# Patient Record
Sex: Female | Born: 1970
Health system: Southern US, Community
[De-identification: ages and names within clinical notes are randomized; demographics above are authoritative.]

## PROBLEM LIST (undated history)

## (undated) DIAGNOSIS — C44729 Squamous cell carcinoma of skin of left lower limb, including hip: Secondary | ICD-10-CM

## (undated) DIAGNOSIS — I10 Essential (primary) hypertension: Secondary | ICD-10-CM

## (undated) DIAGNOSIS — K8020 Calculus of gallbladder without cholecystitis without obstruction: Secondary | ICD-10-CM

## (undated) DIAGNOSIS — T7840XA Allergy, unspecified, initial encounter: Secondary | ICD-10-CM

## (undated) DIAGNOSIS — R011 Cardiac murmur, unspecified: Secondary | ICD-10-CM

## (undated) DIAGNOSIS — N809 Endometriosis, unspecified: Secondary | ICD-10-CM

## (undated) DIAGNOSIS — K219 Gastro-esophageal reflux disease without esophagitis: Secondary | ICD-10-CM

## (undated) DIAGNOSIS — D509 Iron deficiency anemia, unspecified: Secondary | ICD-10-CM

## (undated) DIAGNOSIS — E119 Type 2 diabetes mellitus without complications: Secondary | ICD-10-CM

## (undated) DIAGNOSIS — C801 Malignant (primary) neoplasm, unspecified: Secondary | ICD-10-CM

## (undated) HISTORY — DX: Calculus of gallbladder without cholecystitis without obstruction: K80.20

## (undated) HISTORY — PX: MYOMECTOMY: SHX85

## (undated) HISTORY — DX: Endometriosis, unspecified: N80.9

## (undated) HISTORY — DX: Iron deficiency anemia, unspecified: D50.9

## (undated) HISTORY — DX: Squamous cell carcinoma of skin of left lower limb, including hip: C44.729

## (undated) HISTORY — DX: Malignant (primary) neoplasm, unspecified: C80.1

## (undated) HISTORY — DX: Gastro-esophageal reflux disease without esophagitis: K21.9

## (undated) HISTORY — DX: Cardiac murmur, unspecified: R01.1

## (undated) HISTORY — DX: Allergy, unspecified, initial encounter: T78.40XA

---
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
## 1995-10-17 HISTORY — PX: ABLATION ON ENDOMETRIOSIS: SHX5787

## 1998-08-16 ENCOUNTER — Other Ambulatory Visit: Admission: RE | Admit: 1998-08-16 | Discharge: 1998-08-16 | Payer: Self-pay | Admitting: Obstetrics and Gynecology

## 1999-03-09 ENCOUNTER — Inpatient Hospital Stay (HOSPITAL_COMMUNITY): Admission: AD | Admit: 1999-03-09 | Discharge: 1999-03-09 | Payer: Self-pay | Admitting: Obstetrics and Gynecology

## 1999-03-10 ENCOUNTER — Inpatient Hospital Stay (HOSPITAL_COMMUNITY): Admission: AD | Admit: 1999-03-10 | Discharge: 1999-03-12 | Payer: Self-pay | Admitting: Obstetrics and Gynecology

## 1999-04-29 ENCOUNTER — Other Ambulatory Visit: Admission: RE | Admit: 1999-04-29 | Discharge: 1999-04-29 | Payer: Self-pay | Admitting: Obstetrics and Gynecology

## 2000-08-24 ENCOUNTER — Other Ambulatory Visit: Admission: RE | Admit: 2000-08-24 | Discharge: 2000-08-24 | Payer: Self-pay | Admitting: Obstetrics and Gynecology

## 2001-09-25 ENCOUNTER — Other Ambulatory Visit: Admission: RE | Admit: 2001-09-25 | Discharge: 2001-09-25 | Payer: Self-pay | Admitting: Obstetrics and Gynecology

## 2002-12-02 ENCOUNTER — Other Ambulatory Visit: Admission: RE | Admit: 2002-12-02 | Discharge: 2002-12-02 | Payer: Self-pay | Admitting: Obstetrics and Gynecology

## 2004-02-01 ENCOUNTER — Other Ambulatory Visit: Admission: RE | Admit: 2004-02-01 | Discharge: 2004-02-01 | Payer: Self-pay | Admitting: Obstetrics and Gynecology

## 2005-04-12 ENCOUNTER — Other Ambulatory Visit: Admission: RE | Admit: 2005-04-12 | Discharge: 2005-04-12 | Payer: Self-pay | Admitting: Obstetrics and Gynecology

## 2012-07-01 ENCOUNTER — Other Ambulatory Visit (HOSPITAL_COMMUNITY): Payer: Self-pay | Admitting: Obstetrics and Gynecology

## 2012-07-01 DIAGNOSIS — Z1231 Encounter for screening mammogram for malignant neoplasm of breast: Secondary | ICD-10-CM

## 2012-07-18 ENCOUNTER — Ambulatory Visit (HOSPITAL_COMMUNITY)
Admission: RE | Admit: 2012-07-18 | Discharge: 2012-07-18 | Disposition: A | Discharge status: Home / Self Care | Payer: Self-pay | Source: Ambulatory Visit | Attending: Obstetrics and Gynecology | Admitting: Obstetrics and Gynecology

## 2012-07-18 DIAGNOSIS — Z1231 Encounter for screening mammogram for malignant neoplasm of breast: Secondary | ICD-10-CM

## 2013-06-30 ENCOUNTER — Other Ambulatory Visit (HOSPITAL_COMMUNITY): Payer: Self-pay | Admitting: Obstetrics and Gynecology

## 2013-06-30 DIAGNOSIS — Z1231 Encounter for screening mammogram for malignant neoplasm of breast: Secondary | ICD-10-CM

## 2013-07-28 ENCOUNTER — Ambulatory Visit (HOSPITAL_COMMUNITY)
Admission: RE | Admit: 2013-07-28 | Discharge: 2013-07-28 | Disposition: A | Discharge status: Home / Self Care | Payer: BC Managed Care – PPO | Source: Ambulatory Visit | Attending: Obstetrics and Gynecology | Admitting: Obstetrics and Gynecology

## 2013-07-28 DIAGNOSIS — Z1231 Encounter for screening mammogram for malignant neoplasm of breast: Secondary | ICD-10-CM

## 2014-04-07 ENCOUNTER — Encounter: Payer: Self-pay | Admitting: Internal Medicine

## 2016-07-16 LAB — HM PAP SMEAR: HM Pap smear: NORMAL

## 2016-07-16 LAB — HM MAMMOGRAPHY: HM Mammogram: NORMAL (ref 0–4)

## 2016-09-19 LAB — HM MAMMOGRAPHY

## 2016-09-19 LAB — HM PAP SMEAR: HM Pap smear: NORMAL

## 2016-09-28 LAB — BASIC METABOLIC PANEL: Glucose: 84

## 2016-09-28 LAB — LIPID PANEL
CHOLESTEROL: 161 (ref 0–200)
HDL: 59 (ref 35–70)
LDL CALC: 80
TRIGLYCERIDES: 109 (ref 40–160)

## 2016-09-28 LAB — TSH: TSH: 2.2 (ref <=5.90)

## 2016-11-14 LAB — VITAMIN D 25 HYDROXY (VIT D DEFICIENCY, FRACTURES): Vit D, 25-Hydroxy: 42

## 2017-04-16 ENCOUNTER — Ambulatory Visit (INDEPENDENT_AMBULATORY_CARE_PROVIDER_SITE_OTHER): Payer: BLUE CROSS/BLUE SHIELD | Admitting: Physician Assistant

## 2017-04-16 ENCOUNTER — Encounter: Payer: Self-pay | Admitting: Physician Assistant

## 2017-04-16 VITALS — BP 130/82 | HR 94 | Temp 98.8°F | Resp 14 | Ht 63.0 in | Wt 197.0 lb

## 2017-04-16 DIAGNOSIS — E669 Obesity, unspecified: Secondary | ICD-10-CM | POA: Diagnosis not present

## 2017-04-16 DIAGNOSIS — M722 Plantar fascial fibromatosis: Secondary | ICD-10-CM | POA: Diagnosis not present

## 2017-04-16 DIAGNOSIS — D508 Other iron deficiency anemias: Secondary | ICD-10-CM | POA: Diagnosis not present

## 2017-04-16 DIAGNOSIS — E6609 Other obesity due to excess calories: Secondary | ICD-10-CM | POA: Insufficient documentation

## 2017-04-16 DIAGNOSIS — D649 Anemia, unspecified: Secondary | ICD-10-CM | POA: Insufficient documentation

## 2017-04-16 DIAGNOSIS — Z6834 Body mass index (BMI) 34.0-34.9, adult: Secondary | ICD-10-CM | POA: Insufficient documentation

## 2017-04-16 NOTE — Patient Instructions (Signed)
Please continue supplements and chronic medications as directed. Follow-up with specialists as scheduled. We will get records from Gynecology to review recent lab work.  Please schedule an appointment for a complete physical at your earliest convenience.   Continue well-balanced, portion controlled diet. We will get you back to walking once the fasciitis is resolved. Try giving swimming a try - this will be easy on joints and is great exercise.

## 2017-04-16 NOTE — Progress Notes (Signed)
Pre visit review using our clinic review tool, if applicable. No additional management support is needed unless otherwise documented below in the visit note. 

## 2017-04-16 NOTE — Assessment & Plan Note (Signed)
History of iron deficiency anemia. Asymptomatic. Vitals stable. Will obtain records of recent labs for review. Will add-on additional labs as needed.

## 2017-04-16 NOTE — Progress Notes (Signed)
Patient presents to clinic today to establish care. Body mass index is 34.9 kg/m. Patient endorses well-balanced diet overall. Avoids fried and processed foods. No exercise at present. Was walking about 10 miles per week but had to stop after developing plantar fasciitis. (see below).  Acute Concerns: Denies acute concerns today.  Chronic Issues: Endometriosis -- Long-standing history. On OCPs. Followed by Gynecology (Dr. Radene [name]). No current concerns. States GYN drew labs recently. Will need copies.  Plantar Fasciitis -- Followed by Podiatry. Is having custom orthotics made to help with symptoms.   Health Maintenance: Immunizations -- Declines Tetanus today.  Declines HIV Screening. Mammogram -- Up-to-date. Normal per patient. Followed by GYN. PAP -- Up-to-date. Normal per patient. Followed by GYN. Will need records.  Past Medical History:  Diagnosis Date  . Allergy   . Endometriosis    Diagnosed at age 40. Followed by Gynecology -- Dr. Radene [name]  . Heart murmur   . Iron deficiency anemia    on OTC supplement    Past Surgical History:  Procedure Laterality Date  . ABLATION ON ENDOMETRIOSIS  1997  . deviated setum  1986    No current outpatient prescriptions on file prior to visit.   No current facility-administered medications on file prior to visit.     Allergies  Allergen Reactions  . Erythromycin Rash  . Nitrofurantoin Rash    Family History  Problem Relation Age of Onset  . Hypertension Mother   . Diabetes Mother   . Heart attack Father 29  . Lung disease Father   . Hypertension Father   . Hepatitis C Maternal Uncle   . Lung disease Paternal Uncle   . Emphysema Maternal Grandmother   . Food Allergy Daughter   . Asthma Daughter     Social History   Social History  . Marital status: Married    Spouse name: N/A  . Number of children: N/A  . Years of education: N/A   Occupational History  . Not on file.   Social History Main Topics  . Smoking  status: Never Smoker  . Smokeless tobacco: Never Used  . Alcohol use No  . Drug use: No  . Sexual activity: Yes    Birth control/ protection: Pill   Other Topics Concern  . Not on file   Social History Narrative  . No narrative on file   Review of Systems  Constitutional: Negative for fever and malaise/fatigue.  Respiratory: Negative for cough and shortness of breath.   Cardiovascular: Negative for chest pain and palpitations.  Gastrointestinal: Negative for heartburn.  Neurological: Negative for dizziness and loss of consciousness.  Psychiatric/Behavioral: Negative for depression, hallucinations, substance abuse and suicidal ideas. The patient is not nervous/anxious and does not have insomnia.    BP 130/82   Pulse 94   Temp 98.8 F (37.1 C) (Oral)   Resp 14   Ht 5\' 3"  (1.6 m)   Wt 197 lb (89.4 kg)   SpO2 98%   BMI 34.90 kg/m   Physical Exam  Constitutional: She is oriented to person, place, and time and well-developed, well-nourished, and in no distress.  HENT:  Head: Normocephalic and atraumatic.  Eyes: Conjunctivae are normal.  Neck: Neck supple.  Cardiovascular: Normal rate, regular rhythm, normal heart sounds and intact distal pulses.   Pulmonary/Chest: Effort normal and breath sounds normal. No respiratory distress. She has no wheezes. She has no rales. She exhibits no tenderness.  Neurological: She is alert and oriented to person, place, and time.  Skin: Skin is warm and dry. No rash noted.  Psychiatric: Affect normal.  Vitals reviewed.  Assessment/Plan: Plantar fasciitis Chronic . Continued despite conservative measures. Is followed by Podiatry. Is going to start custom orthotics. Follow-up with specialist as scheduled.  Class 1 obesity without serious comorbidity in adult Body mass index is 34.9 kg/m. Discussed appropriate diet and exercise regimen. Will obtain recent labs and assessment with GYN. Patient to schedule CPE.  Absolute anemia History of  iron deficiency anemia. Asymptomatic. Vitals stable. Will obtain records of recent labs for review. Will add-on additional labs as needed.    Anna Rio, PA-C

## 2017-04-16 NOTE — Assessment & Plan Note (Signed)
Chronic . Continued despite conservative measures. Is followed by Podiatry. Is going to start custom orthotics. Follow-up with specialist as scheduled.

## 2017-04-16 NOTE — Assessment & Plan Note (Signed)
Body mass index is 34.9 kg/m. Discussed appropriate diet and exercise regimen. Will obtain recent labs and assessment with GYN. Patient to schedule CPE.

## 2017-04-24 ENCOUNTER — Encounter: Payer: Self-pay | Admitting: Emergency Medicine

## 2017-12-12 ENCOUNTER — Other Ambulatory Visit (HOSPITAL_COMMUNITY): Payer: Self-pay | Admitting: Obstetrics and Gynecology

## 2017-12-12 DIAGNOSIS — D1800 Hemangioma unspecified site: Secondary | ICD-10-CM

## 2017-12-14 ENCOUNTER — Ambulatory Visit (HOSPITAL_COMMUNITY): Payer: BLUE CROSS/BLUE SHIELD

## 2017-12-18 ENCOUNTER — Ambulatory Visit (HOSPITAL_COMMUNITY)
Admission: RE | Admit: 2017-12-18 | Discharge: 2017-12-18 | Disposition: A | Discharge status: Home / Self Care | Payer: BLUE CROSS/BLUE SHIELD | Source: Ambulatory Visit | Attending: Obstetrics and Gynecology | Admitting: Obstetrics and Gynecology

## 2017-12-18 DIAGNOSIS — K8020 Calculus of gallbladder without cholecystitis without obstruction: Secondary | ICD-10-CM | POA: Diagnosis not present

## 2017-12-18 DIAGNOSIS — D1800 Hemangioma unspecified site: Secondary | ICD-10-CM | POA: Insufficient documentation

## 2017-12-25 ENCOUNTER — Encounter: Payer: Self-pay | Admitting: Physician Assistant

## 2018-01-04 ENCOUNTER — Ambulatory Visit: Payer: BLUE CROSS/BLUE SHIELD | Admitting: Physician Assistant

## 2018-01-04 ENCOUNTER — Encounter: Payer: Self-pay | Admitting: Physician Assistant

## 2018-01-04 ENCOUNTER — Other Ambulatory Visit (INDEPENDENT_AMBULATORY_CARE_PROVIDER_SITE_OTHER): Payer: BLUE CROSS/BLUE SHIELD

## 2018-01-04 VITALS — BP 132/80 | HR 78 | Ht 63.0 in | Wt 194.2 lb

## 2018-01-04 DIAGNOSIS — R933 Abnormal findings on diagnostic imaging of other parts of digestive tract: Secondary | ICD-10-CM

## 2018-01-04 DIAGNOSIS — R1013 Epigastric pain: Secondary | ICD-10-CM

## 2018-01-04 DIAGNOSIS — K760 Fatty (change of) liver, not elsewhere classified: Secondary | ICD-10-CM | POA: Diagnosis not present

## 2018-01-04 DIAGNOSIS — K8020 Calculus of gallbladder without cholecystitis without obstruction: Secondary | ICD-10-CM

## 2018-01-04 LAB — CBC WITH DIFFERENTIAL/PLATELET
BASOS ABS: 0.1 10*3/uL (ref 0.0–0.1)
Basophils Relative: 1 % (ref 0.0–3.0)
EOS ABS: 0.2 10*3/uL (ref 0.0–0.7)
Eosinophils Relative: 2.6 % (ref 0.0–5.0)
HEMATOCRIT: 33.2 % — AB (ref 36.0–46.0)
Hemoglobin: 11.2 g/dL — ABNORMAL LOW (ref 12.0–15.0)
LYMPHS PCT: 20.3 % (ref 12.0–46.0)
Lymphs Abs: 1.7 10*3/uL (ref 0.7–4.0)
MCHC: 33.7 g/dL (ref 30.0–36.0)
MCV: 77 fl — ABNORMAL LOW (ref 78.0–100.0)
MONO ABS: 0.5 10*3/uL (ref 0.1–1.0)
Monocytes Relative: 5.5 % (ref 3.0–12.0)
NEUTROS ABS: 5.8 10*3/uL (ref 1.4–7.7)
Neutrophils Relative %: 70.6 % (ref 43.0–77.0)
PLATELETS: 348 10*3/uL (ref 150.0–400.0)
RBC: 4.31 Mil/uL (ref 3.87–5.11)
RDW: 13.9 % (ref 11.5–15.5)
WBC: 8.2 10*3/uL (ref 4.0–10.5)

## 2018-01-04 LAB — COMPREHENSIVE METABOLIC PANEL
ALBUMIN: 3.7 g/dL (ref 3.5–5.2)
ALT: 14 U/L (ref 0–35)
AST: 15 U/L (ref 0–37)
Alkaline Phosphatase: 72 U/L (ref 39–117)
BILIRUBIN TOTAL: 0.4 mg/dL (ref 0.2–1.2)
BUN: 12 mg/dL (ref 6–23)
CO2: 25 mEq/L (ref 19–32)
CREATININE: 0.58 mg/dL (ref 0.40–1.20)
Calcium: 8.7 mg/dL (ref 8.4–10.5)
Chloride: 106 mEq/L (ref 96–112)
GFR: 117.85 mL/min (ref >60.00)
Glucose, Bld: 109 mg/dL — ABNORMAL HIGH (ref 70–99)
Potassium: 3.9 mEq/L (ref 3.5–5.1)
Sodium: 140 mEq/L (ref 135–145)
Total Protein: 6.8 g/dL (ref 6.0–8.3)

## 2018-01-04 MED ORDER — PANTOPRAZOLE SODIUM 40 MG PO TBEC: 40.0000 mg | DELAYED_RELEASE_TABLET | Freq: Every day | ORAL | 3 refills | Status: DC

## 2018-01-04 NOTE — Progress Notes (Addendum)
Chief Complaint: Abnormal imaging of the liver  HPI:    Anna [name] is a 47 year old female with a past medical history as listed below, who was referred to Dr. Carlean Purl by Brunetta Jeans, PA-C and is seen for a complaint of abnormal imaging of the liver.      Right upper quadrant ultrasound 12/18/17 showed the liver of increased echogenicity consistent with fatty infiltration.  Several hypo-echoic structures scattered throughout the liver largest measuring up to 6.8 cm.  Which were thought to be hemangiomas but MRI was recommended.  2 cm gallstone without evidence of gallbladder inflammation.    Today, explains that for the past year she has felt bloated and gassy, apparently a few years back she had complained of some abdominal pain and had what she thinks was a CT of her abdomen (we do not have record of this) which showed a fatty liver and per recommendations from her gynecologist this year, she had a repeat ultrasound with findings of fatty liver and " lesions" per the patient.      Patient notes that she does have some epigastric discomfort which continues as well as left-sided back pain.  This has been unchanged recently.  Patient does admit to a high caffeine intake of at least 3 cokes a day.  Also does not abide by a very healthy diet in general.  Occasional heartburn/ reflux.    Denies fever, chills, weight loss, anorexia, nausea, vomiting, dysphasia, change in bowel habits or symptoms that awaken her at night.  Past Medical History:  Diagnosis Date  . Allergy   . Endometriosis    Diagnosed at age 55. Followed by Gynecology -- Dr. Radene Knee  . Heart murmur   . Iron deficiency anemia    on OTC supplement    Past Surgical History:  Procedure Laterality Date  . ABLATION ON ENDOMETRIOSIS  1997  . deviated setum  1986    Current Outpatient Medications  Medication Sig Dispense Refill  . ZOVIA 1/50E, 28, 1-50 MG-MCG tablet TK 1 T PO D  6   No current facility-administered medications  for this visit.     Allergies as of 01/04/2018 - Review Complete 04/16/2017  Allergen Reaction Noted  . Erythromycin Rash 04/29/2016  . Nitrofurantoin Rash 04/29/2016    Family History  Problem Relation Age of Onset  . Hypertension Mother   . Diabetes Mother   . Heart attack Father 33  . Lung disease Father   . Hypertension Father   . Hepatitis C Maternal Uncle   . Lung disease Paternal Uncle   . Emphysema Maternal Grandmother   . Food Allergy Daughter   . Asthma Daughter     Social History   Socioeconomic History  . Marital status: Married    Spouse name: Not on file  . Number of children: Not on file  . Years of education: Not on file  . Highest education level: Not on file  Occupational History  . Not on file  Social Needs  . Financial resource strain: Not on file  . Food insecurity:    Worry: Not on file    Inability: Not on file  . Transportation needs:    Medical: Not on file    Non-medical: Not on file  Tobacco Use  . Smoking status: Never Smoker  . Smokeless tobacco: Never Used  Substance and Sexual Activity  . Alcohol use: No  . Drug use: No  . Sexual activity: Yes    Birth control/protection:  Pill  Lifestyle  . Physical activity:    Days per week: Not on file    Minutes per session: Not on file  . Stress: Not on file  Relationships  . Social connections:    Talks on phone: Not on file    Gets together: Not on file    Attends religious service: Not on file    Active member of club or organization: Not on file    Attends meetings of clubs or organizations: Not on file    Relationship status: Not on file  . Intimate partner violence:    Fear of current or ex partner: Not on file    Emotionally abused: Not on file    Physically abused: Not on file    Forced sexual activity: Not on file  Other Topics Concern  . Not on file  Social History Narrative  . Not on file    Review of Systems:    Constitutional: No weight loss, fever or  chills Skin: No rash  Cardiovascular: No chest pain Respiratory: No SOB  Gastrointestinal: See HPI and otherwise negative Genitourinary: No dysuria  Neurological: No headache Musculoskeletal: No new muscle or joint pain Hematologic: No bleeding  Psychiatric: No history of depression or anxiety   Physical Exam:  Vital signs: BP 132/80   Pulse 78   Ht 5\' 3"  (1.6 m)   Wt 194 lb 3.2 oz (88.1 kg)   SpO2 98%   BMI 34.40 kg/m   Constitutional:   Pleasant overweight Caucasian female appears to be in NAD, Well developed, Well nourished, alert and cooperative Head:  Normocephalic and atraumatic. Eyes:   PEERL, EOMI. No icterus. Conjunctiva pink. Ears:  Normal auditory acuity. Neck:  Supple Throat: Oral cavity and pharynx without inflammation, swelling or lesion.  Respiratory: Respirations even and unlabored. Lungs clear to auscultation bilaterally.   No wheezes, crackles, or rhonchi.  Cardiovascular: Normal S1, S2. No MRG. Regular rate and rhythm. No peripheral edema, cyanosis or pallor.  Gastrointestinal:  Soft, nondistended,mild epigastric ttp, No rebound or guarding. Normal bowel sounds. No appreciable masses or hepatomegaly. Rectal:  Not performed.  Msk:  Symmetrical without gross deformities. Without edema, no deformity or joint abnormality.  Neurologic:  Alert and  oriented x4;  grossly normal neurologically.  Skin:   Dry and intact without significant lesions or rashes. Psychiatric: Demonstrates good judgement and reason without abnormal affect or behaviors.  See HPI for recent imaging.  Assessment: 1.  Fatty liver: Seen at time of recent ultrasound, no recent labs 2.  Abnormal ultrasound of the liver: Showing likely hemangiomas, recommending MRI for further evaluation 3.  Epigastric pain: At time of exam, also with bloating and occasional reflux; consider gastritis vs IBS 4.  Gallstones: Seen at time of ultrasound, patient does not have any symptoms consistent with gallbladder  etiology today  Plan: 1.  Prescribed Pantoprazole 40 mg daily, 30-60 minutes before breakfast #30 with 2 refills. 2.  Ordered MRI with liver protocol for further evaluation of likely hemangiomas seen at time of ultrasound. 3.  Ordered labs to include a CBC and CMP today. 4.  Encouraged a slow and steady weight loss of 1-2 pounds per week 5.  Discussed gallstone today.  I do not believe this is causing any of patient's current symptoms and I do not think she needs to be referred to surgery at this time.  Patient was relieved. 6.  Patient will follow up with me in 4-6 weeks or sooner if necessary. Per  referral request patient will be assigned to Dr. Carlean Purl.   Ellouise Newer, PA-C 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

## 2018-01-04 NOTE — Patient Instructions (Addendum)
If you are age 48 or older, your body mass index should be between 23-30. Your Body mass index is 34.4 kg/m. If this is out of the aforementioned range listed, please consider follow up with your Primary Care Provider.  If you are age 10 or younger, your body mass index should be between 19-25. Your Body mass index is 34.4 kg/m. If this is out of the aformentioned range listed, please consider follow up with your Primary Care Provider.   We have sent the following medications to your pharmacy for you to pick up at your convenience: Pantoprazole 40mg  everyday 30 minutes to one hour before breakfast and dinner.  Your physician has requested that you go to the basement for the following lab work before leaving today: CBC CMET  You have been scheduled for an MRI at [facility] on 01/10/18. Your appointment time is 4:50pm. Please arrive 15 minutes prior to your appointment time for registration purposes. Please make certain not to have anything to eat or drink 4 hours prior to your test. In addition, if you have any metal in your body, have a pacemaker or defibrillator, please be sure to let your ordering physician know. This test typically takes 45 minutes to 1 hour to complete. Should you need to reschedule, please call [phone] to do so.

## 2018-01-07 ENCOUNTER — Other Ambulatory Visit: Payer: Self-pay

## 2018-01-07 ENCOUNTER — Other Ambulatory Visit (INDEPENDENT_AMBULATORY_CARE_PROVIDER_SITE_OTHER): Payer: BLUE CROSS/BLUE SHIELD

## 2018-01-07 DIAGNOSIS — D649 Anemia, unspecified: Secondary | ICD-10-CM

## 2018-01-07 LAB — IBC PANEL
Iron: 22 ug/dL — ABNORMAL LOW (ref 42–145)
Saturation Ratios: 5.2 % — ABNORMAL LOW (ref 20.0–50.0)
TRANSFERRIN: 302 mg/dL (ref 212.0–360.0)

## 2018-01-07 LAB — FERRITIN: Ferritin: 22.4 ng/mL (ref 10.0–291.0)

## 2018-01-10 ENCOUNTER — Ambulatory Visit
Admission: RE | Admit: 2018-01-10 | Discharge: 2018-01-10 | Disposition: A | Discharge status: Home / Self Care | Payer: BLUE CROSS/BLUE SHIELD | Source: Ambulatory Visit | Attending: Physician Assistant | Admitting: Physician Assistant

## 2018-01-10 DIAGNOSIS — R933 Abnormal findings on diagnostic imaging of other parts of digestive tract: Secondary | ICD-10-CM

## 2018-01-10 MED ORDER — GADOBENATE DIMEGLUMINE 529 MG/ML IV SOLN
18.0000 mL | Freq: Once | INTRAVENOUS | Status: AC | PRN
  Administered 2018-01-10: 18 mL via INTRAVENOUS

## 2018-01-11 NOTE — Progress Notes (Signed)
1) Would follow radiologist recommendations  2) She has iron deficiency anemia - so we think that is from heavy menses or does she need to schedule an EGD/colonoscopy? (I se she has f/u me 5/29 but if she is still having the epigastric pain and we are not pretty sure anemia is non-GI in oriigin can schedule EGD/colonoscopy

## 2018-01-15 ENCOUNTER — Other Ambulatory Visit: Payer: Self-pay

## 2018-01-15 ENCOUNTER — Encounter: Payer: Self-pay | Admitting: Physician Assistant

## 2018-01-15 ENCOUNTER — Ambulatory Visit: Payer: BLUE CROSS/BLUE SHIELD | Admitting: Physician Assistant

## 2018-01-15 VITALS — BP 134/76 | HR 98 | Temp 98.7°F | Resp 14 | Ht 63.0 in | Wt 195.0 lb

## 2018-01-15 DIAGNOSIS — L03031 Cellulitis of right toe: Secondary | ICD-10-CM | POA: Diagnosis not present

## 2018-01-15 MED ORDER — MUPIROCIN 2 % EX OINT: 1.0000 "application " | TOPICAL_OINTMENT | Freq: Two times a day (BID) | CUTANEOUS | 0 refills | Status: DC

## 2018-01-15 NOTE — Patient Instructions (Addendum)
Please do the peroxide soaks as directed twice daily.  Do these for 5-10 minutes. Afterwards dry the foot well.  Pull the skin back from the nail. Apply the Bactroban twice daily.  Wear the appropriate sized footwear. If symptoms are not improving within 72 hours, call me and we will need to add on an oral antibiotic.   If you note any fever or increased pain, call or come see me ASAP.

## 2018-01-15 NOTE — Progress Notes (Signed)
Patient presents to clinic today c/o redness and swelling around the R great toe over the past 2 weeks. Notes some initial drainage that has resolved. Notes tenderness to the area. Denies decreased ROM or pain with ambulation. Denies fever, chills, malaise or fatigue.  Past Medical History:  Diagnosis Date  . Allergy   . Endometriosis    Diagnosed at age 47. Followed by Gynecology -- Dr. Radene Knee  . Gallstones   . Heart murmur   . Iron deficiency anemia    on OTC supplement    Current Outpatient Medications on File Prior to Visit  Medication Sig Dispense Refill  . ferrous sulfate 325 (65 FE) MG tablet Take 325 mg by mouth daily with breakfast.    . ibuprofen (ADVIL,MOTRIN) 200 MG tablet Take 200 mg by mouth every 6 (six) hours as needed.    . pantoprazole (PROTONIX) 40 MG tablet Take 1 tablet (40 mg total) by mouth daily. Take 30 minutes to one hour before breakfast and after dinner. 30 tablet 3  . ZOVIA 1/50E, 28, 1-50 MG-MCG tablet TK 1 T PO D  6   No current facility-administered medications on file prior to visit.     Allergies  Allergen Reactions  . Erythromycin Rash  . Nitrofurantoin Rash    Family History  Problem Relation Age of Onset  . Hypertension Mother   . Diabetes Mother   . Heart attack Father 77  . Lung disease Father   . Hypertension Father   . Hepatitis C Maternal Uncle   . Lung disease Paternal Uncle   . Emphysema Maternal Grandmother   . Food Allergy Daughter   . Asthma Daughter     Social History   Socioeconomic History  . Marital status: Married    Spouse name: Not on file  . Number of children: Not on file  . Years of education: Not on file  . Highest education level: Not on file  Occupational History  . Not on file  Social Needs  . Financial resource strain: Not on file  . Food insecurity:    Worry: Not on file    Inability: Not on file  . Transportation needs:    Medical: Not on file    Non-medical: Not on file  Tobacco Use  .  Smoking status: Never Smoker  . Smokeless tobacco: Never Used  Substance and Sexual Activity  . Alcohol use: No  . Drug use: No  . Sexual activity: Yes    Birth control/protection: Pill  Lifestyle  . Physical activity:    Days per week: Not on file    Minutes per session: Not on file  . Stress: Not on file  Relationships  . Social connections:    Talks on phone: Not on file    Gets together: Not on file    Attends religious service: Not on file    Active member of club or organization: Not on file    Attends meetings of clubs or organizations: Not on file    Relationship status: Not on file  Other Topics Concern  . Not on file  Social History Narrative  . Not on file    Review of Systems - See HPI.  All other ROS are negative.  BP 134/76   Pulse 98   Temp 98.7 F (37.1 C) (Oral)   Resp 14   Ht _0  (1.6 m)   Wt 195 lb (88.5 kg)   SpO2 98%   BMI 34.54 kg/m  Physical Exam  Constitutional: She is oriented to person, place, and time and well-developed, well-nourished, and in no distress.  HENT:  Head: Normocephalic and atraumatic.  Eyes: Conjunctivae are normal.  Cardiovascular: Normal rate, regular rhythm, normal heart sounds and intact distal pulses.  Pulmonary/Chest: Effort normal and breath sounds normal.  Neurological: She is alert and oriented to person, place, and time.  Skin: Skin is warm and dry.     Psychiatric: Affect normal.  Vitals reviewed.   Recent Results (from the past 2160 hour(s))  Comp Met (CMET)     Status: Abnormal   Collection Time: 01/04/18  9:35 AM  Result Value Ref Range   Sodium 140 135 - 145 mEq/L   Potassium 3.9 3.5 - 5.1 mEq/L   Chloride 106 96 - 112 mEq/L   CO2 25 19 - 32 mEq/L   Glucose, Bld 109 (H) 70 - 99 mg/dL   BUN 12 6 - 23 mg/dL   Creatinine, Ser 0.58 0.40 - 1.20 mg/dL   Total Bilirubin 0.4 0.2 - 1.2 mg/dL   Alkaline Phosphatase 72 39 - 117 U/L   AST 15 0 - 37 U/L   ALT 14 0 - 35 U/L   Total Protein 6.8 6.0 - 8.3  g/dL   Albumin 3.7 3.5 - 5.2 g/dL   Calcium 8.7 8.4 - 10.5 mg/dL   GFR 117.85 >60.00 mL/min  CBC w/Diff     Status: Abnormal   Collection Time: 01/04/18  9:35 AM  Result Value Ref Range   WBC 8.2 4.0 - 10.5 K/uL   RBC 4.31 3.87 - 5.11 Mil/uL   Hemoglobin 11.2 (L) 12.0 - 15.0 g/dL   HCT 33.2 (L) 36.0 - 46.0 %   MCV 77.0 (L) 78.0 - 100.0 fl   MCHC 33.7 30.0 - 36.0 g/dL   RDW 13.9 11.5 - 15.5 %   Platelets 348.0 150.0 - 400.0 K/uL   Neutrophils Relative % 70.6 43.0 - 77.0 %   Lymphocytes Relative 20.3 12.0 - 46.0 %   Monocytes Relative 5.5 3.0 - 12.0 %   Eosinophils Relative 2.6 0.0 - 5.0 %   Basophils Relative 1.0 0.0 - 3.0 %   Neutro Abs 5.8 1.4 - 7.7 K/uL   Lymphs Abs 1.7 0.7 - 4.0 K/uL   Monocytes Absolute 0.5 0.1 - 1.0 K/uL   Eosinophils Absolute 0.2 0.0 - 0.7 K/uL   Basophils Absolute 0.1 0.0 - 0.1 K/uL  IBC panel     Status: Abnormal   Collection Time: 01/07/18  3:36 PM  Result Value Ref Range   Iron 22 (L) 42 - 145 ug/dL   Transferrin 302.0 212.0 - 360.0 mg/dL   Saturation Ratios 5.2 (L) 20.0 - 50.0 %  Ferritin     Status: None   Collection Time: 01/07/18  3:36 PM  Result Value Ref Range   Ferritin 22.4 10.0 - 291.0 ng/mL    Assessment/Plan: 1. Paronychia of great toe of right foot Start soaks and Mupirocin ointment. Supportive measures reviewed. If not improving within 72 hours, will start oral antibiotic. Strict return precautions reviewed with patient.  - mupirocin ointment (BACTROBAN) 2 %; Apply 1 application topically 2 (two) times daily.  Dispense: 22 g; Refill: 0   Leeanne Rio, PA-C

## 2018-01-18 ENCOUNTER — Telehealth: Payer: Self-pay | Admitting: Physician Assistant

## 2018-01-18 MED ORDER — AMOXICILLIN-POT CLAVULANATE 875-125 MG PO TABS: 1.0000 | ORAL_TABLET | Freq: Two times a day (BID) | ORAL | 0 refills | Status: DC

## 2018-01-18 NOTE — Telephone Encounter (Signed)
Spoke with patient and she is unable to check her temp. She states the toe is still red and burning. She has been soaking and applying the topical ointment twice daily. She is scheduled for Tuesday at 1p for follow up.

## 2018-01-18 NOTE — Telephone Encounter (Signed)
Routed to Jennifer

## 2018-01-18 NOTE — Telephone Encounter (Signed)
Please call patient to make sure she is not having any fevers. We did discuss if not having major improvement within 48 hours with soaks and topical medication, we would start oral antibiotic.   I have sent in an Rx for augmentin 875-125 mg to take 1 tablet BID x 7 days. Follow-up with me on Monday.

## 2018-01-18 NOTE — Telephone Encounter (Signed)
Patient calling wanting to know if she needs to have endo colon done before ov with Dr.Gessner on 5.29.19. Pt had MRI on 3.28.19 and doesn't think she needs ov until after procedure. Please advise.

## 2018-01-18 NOTE — Telephone Encounter (Signed)
Copied from [place]. Topic: Quick Communication - See Telephone Encounter >> Jan 18, 2018  9:38 AM Cleaster Corin, NT wrote: CRM for notification. See Telephone encounter for: 01/18/18. Pt. Calling to let PA Elyn Aquas know that she does need an antibiotic for her foot   [place], [place] - [address] [place] [place] [address] Phone: [phone] Fax: [phone]

## 2018-01-22 ENCOUNTER — Ambulatory Visit: Payer: BLUE CROSS/BLUE SHIELD | Admitting: Physician Assistant

## 2018-01-22 ENCOUNTER — Other Ambulatory Visit: Payer: Self-pay

## 2018-01-22 ENCOUNTER — Encounter: Payer: Self-pay | Admitting: Physician Assistant

## 2018-01-22 VITALS — BP 130/76 | HR 92 | Temp 98.1°F | Resp 16 | Ht 63.0 in | Wt 193.0 lb

## 2018-01-22 DIAGNOSIS — L03031 Cellulitis of right toe: Secondary | ICD-10-CM

## 2018-01-22 NOTE — Progress Notes (Signed)
Patient presents to clinic today for follow-up of paronychia of great toe of R foot. Patient was initially started on topical Bactroban but this was changed to oral Augmentin due to lack of improvement. Is taking as directed and tolerating well. Has noted significant improvement in the area since starting oral abx on Friday. Denies fever, chills. Denies new or worsening symptoms.    Past Medical History:  Diagnosis Date  . Allergy   . Endometriosis    Diagnosed at age 33. Followed by Gynecology -- Dr. Radene Knee  . Gallstones   . Heart murmur   . Iron deficiency anemia    on OTC supplement    Current Outpatient Medications on File Prior to Visit  Medication Sig Dispense Refill  . amoxicillin-clavulanate (AUGMENTIN) 875-125 MG tablet Take 1 tablet by mouth 2 (two) times daily. 14 tablet 0  . ferrous sulfate 325 (65 FE) MG tablet Take 325 mg by mouth daily with breakfast.    . ibuprofen (ADVIL,MOTRIN) 200 MG tablet Take 200 mg by mouth every 6 (six) hours as needed.    . mupirocin ointment (BACTROBAN) 2 % Apply 1 application topically 2 (two) times daily. 22 g 0  . pantoprazole (PROTONIX) 40 MG tablet Take 1 tablet (40 mg total) by mouth daily. Take 30 minutes to one hour before breakfast and after dinner. 30 tablet 3  . ZOVIA 1/50E, 28, 1-50 MG-MCG tablet TK 1 T PO D  6   No current facility-administered medications on file prior to visit.     Allergies  Allergen Reactions  . Erythromycin Rash  . Nitrofurantoin Rash    Family History  Problem Relation Age of Onset  . Hypertension Mother   . Diabetes Mother   . Heart attack Father 66  . Lung disease Father   . Hypertension Father   . Hepatitis C Maternal Uncle   . Lung disease Paternal Uncle   . Emphysema Maternal Grandmother   . Food Allergy Daughter   . Asthma Daughter     Social History   Socioeconomic History  . Marital status: Married    Spouse name: Not on file  . Number of children: Not on file  . Years of  education: Not on file  . Highest education level: Not on file  Occupational History  . Not on file  Social Needs  . Financial resource strain: Not on file  . Food insecurity:    Worry: Not on file    Inability: Not on file  . Transportation needs:    Medical: Not on file    Non-medical: Not on file  Tobacco Use  . Smoking status: Never Smoker  . Smokeless tobacco: Never Used  Substance and Sexual Activity  . Alcohol use: No  . Drug use: No  . Sexual activity: Yes    Birth control/protection: Pill  Lifestyle  . Physical activity:    Days per week: Not on file    Minutes per session: Not on file  . Stress: Not on file  Relationships  . Social connections:    Talks on phone: Not on file    Gets together: Not on file    Attends religious service: Not on file    Active member of club or organization: Not on file    Attends meetings of clubs or organizations: Not on file    Relationship status: Not on file  Other Topics Concern  . Not on file  Social History Narrative  . Not on file  Review of Systems - See HPI.  All other ROS are negative.  BP 130/76   Pulse 92   Temp 98.1 F (36.7 C) (Oral)   Resp 16   Ht _0  (1.6 m)   Wt 193 lb (87.5 kg)   SpO2 98%   BMI 34.19 kg/m   Physical Exam  Constitutional: She appears well-developed and well-nourished.  Pulmonary/Chest: Effort normal.  Skin: Skin is warm and dry.     Vitals reviewed.   Recent Results (from the past 2160 hour(s))  Comp Met (CMET)     Status: Abnormal   Collection Time: 01/04/18  9:35 AM  Result Value Ref Range   Sodium 140 135 - 145 mEq/L   Potassium 3.9 3.5 - 5.1 mEq/L   Chloride 106 96 - 112 mEq/L   CO2 25 19 - 32 mEq/L   Glucose, Bld 109 (H) 70 - 99 mg/dL   BUN 12 6 - 23 mg/dL   Creatinine, Ser 0.58 0.40 - 1.20 mg/dL   Total Bilirubin 0.4 0.2 - 1.2 mg/dL   Alkaline Phosphatase 72 39 - 117 U/L   AST 15 0 - 37 U/L   ALT 14 0 - 35 U/L   Total Protein 6.8 6.0 - 8.3 g/dL   Albumin 3.7  3.5 - 5.2 g/dL   Calcium 8.7 8.4 - 10.5 mg/dL   GFR 117.85 >60.00 mL/min  CBC w/Diff     Status: Abnormal   Collection Time: 01/04/18  9:35 AM  Result Value Ref Range   WBC 8.2 4.0 - 10.5 K/uL   RBC 4.31 3.87 - 5.11 Mil/uL   Hemoglobin 11.2 (L) 12.0 - 15.0 g/dL   HCT 33.2 (L) 36.0 - 46.0 %   MCV 77.0 (L) 78.0 - 100.0 fl   MCHC 33.7 30.0 - 36.0 g/dL   RDW 13.9 11.5 - 15.5 %   Platelets 348.0 150.0 - 400.0 K/uL   Neutrophils Relative % 70.6 43.0 - 77.0 %   Lymphocytes Relative 20.3 12.0 - 46.0 %   Monocytes Relative 5.5 3.0 - 12.0 %   Eosinophils Relative 2.6 0.0 - 5.0 %   Basophils Relative 1.0 0.0 - 3.0 %   Neutro Abs 5.8 1.4 - 7.7 K/uL   Lymphs Abs 1.7 0.7 - 4.0 K/uL   Monocytes Absolute 0.5 0.1 - 1.0 K/uL   Eosinophils Absolute 0.2 0.0 - 0.7 K/uL   Basophils Absolute 0.1 0.0 - 0.1 K/uL  IBC panel     Status: Abnormal   Collection Time: 01/07/18  3:36 PM  Result Value Ref Range   Iron 22 (L) 42 - 145 ug/dL   Transferrin 302.0 212.0 - 360.0 mg/dL   Saturation Ratios 5.2 (L) 20.0 - 50.0 %  Ferritin     Status: None   Collection Time: 01/07/18  3:36 PM  Result Value Ref Range   Ferritin 22.4 10.0 - 291.0 ng/mL   Assessment/Plan: 1. Paronychia of great toe, right Finish entire course of Augmentin. Continue soaks. Return precautions reviewed with patient.    Leeanne Rio, PA-C

## 2018-01-22 NOTE — Patient Instructions (Signed)
Please stay well-hydrated and get plenty of rest.  Continue the peroxide soaks. You can stop the topical antibiotic. Finish the entire course of the Augmentin. If symptoms are not completely resolved by completion of antibiotic, please call or come see me.  Return immediately for any new or worsening symptoms.

## 2018-01-25 NOTE — Telephone Encounter (Signed)
Pt calling to check on the status of this request. She stated that we told her that Dr. Carlean Purl recommended to have an endocolon based on the results of her [place] so she was calling to schedule it. Pls call her. Thank you

## 2018-01-28 NOTE — Telephone Encounter (Signed)
The pt has been scheduled for endo colon and previsit.  She was advised and all questions answered

## 2018-03-13 ENCOUNTER — Ambulatory Visit: Payer: BLUE CROSS/BLUE SHIELD | Admitting: Internal Medicine

## 2018-03-16 HISTORY — PX: COLONOSCOPY: SHX174

## 2018-03-16 HISTORY — PX: ESOPHAGOGASTRODUODENOSCOPY: SHX1529

## 2018-03-19 ENCOUNTER — Other Ambulatory Visit: Payer: Self-pay

## 2018-03-19 ENCOUNTER — Ambulatory Visit (AMBULATORY_SURGERY_CENTER): Payer: Self-pay

## 2018-03-19 VITALS — Ht 63.5 in | Wt 197.0 lb

## 2018-03-19 DIAGNOSIS — D509 Iron deficiency anemia, unspecified: Secondary | ICD-10-CM

## 2018-03-19 NOTE — Progress Notes (Signed)
Denies allergies to eggs or soy products. Denies complication of anesthesia or sedation. Denies use of weight loss medication. Denies use of O2.   Emmi instructions declined.  

## 2018-03-22 ENCOUNTER — Encounter: Payer: Self-pay | Admitting: Internal Medicine

## 2018-03-29 ENCOUNTER — Encounter: Payer: Self-pay | Admitting: Internal Medicine

## 2018-03-29 ENCOUNTER — Ambulatory Visit (AMBULATORY_SURGERY_CENTER): Payer: BLUE CROSS/BLUE SHIELD | Admitting: Internal Medicine

## 2018-03-29 ENCOUNTER — Other Ambulatory Visit: Payer: Self-pay

## 2018-03-29 VITALS — BP 146/84 | HR 97 | Temp 99.3°F | Resp 14 | Ht 63.0 in | Wt 193.0 lb

## 2018-03-29 DIAGNOSIS — K317 Polyp of stomach and duodenum: Secondary | ICD-10-CM | POA: Diagnosis not present

## 2018-03-29 DIAGNOSIS — K529 Noninfective gastroenteritis and colitis, unspecified: Secondary | ICD-10-CM | POA: Diagnosis not present

## 2018-03-29 DIAGNOSIS — K259 Gastric ulcer, unspecified as acute or chronic, without hemorrhage or perforation: Secondary | ICD-10-CM | POA: Diagnosis not present

## 2018-03-29 DIAGNOSIS — D509 Iron deficiency anemia, unspecified: Secondary | ICD-10-CM

## 2018-03-29 MED ORDER — SODIUM CHLORIDE 0.9 % IV SOLN: 500.0000 mL | Freq: Once | INTRAVENOUS | Status: DC

## 2018-03-29 NOTE — Patient Instructions (Addendum)
There were some tiny spots of inflammation in the stomach and some stomach polyps - I took biopsies. I will let you know those results - and I also took biopsies of the intestine to check for gluten allergy (celiac disease).  The colonoscopy was normal.  Once I get the results we will contact you and determine follow-up.  I appreciate the opportunity to care for you. Gatha Mayer, MD, FACG  YOU HAD AN ENDOSCOPIC PROCEDURE TODAY AT [facility]:   Refer to the procedure report that was given to you for any specific questions about what was found during the examination.  If the procedure report does not answer your questions, please call your gastroenterologist to clarify.  If you requested that your care partner not be given the details of your procedure findings, then the procedure report has been included in a sealed envelope for you to review at your convenience later.  YOU SHOULD EXPECT: Some feelings of bloating in the abdomen. Passage of more gas than usual.  Walking can help get rid of the air that was put into your GI tract during the procedure and reduce the bloating. If you had a lower endoscopy (such as a colonoscopy or flexible sigmoidoscopy) you may notice spotting of blood in your stool or on the toilet paper. If you underwent a bowel prep for your procedure, you may not have a normal bowel movement for a few days.  Please Note:  You might notice some irritation and congestion in your nose or some drainage.  This is from the oxygen used during your procedure.  There is no need for concern and it should clear up in a day or so.  SYMPTOMS TO REPORT IMMEDIATELY:   Following lower endoscopy (colonoscopy or flexible sigmoidoscopy):  Excessive amounts of blood in the stool  Significant tenderness or worsening of abdominal pains  Swelling of the abdomen that is new, acute  Fever of 100F or higher   Following upper endoscopy (EGD)  Vomiting of blood or  coffee ground material  New chest pain or pain under the shoulder blades  Painful or persistently difficult swallowing  New shortness of breath  Fever of 100F or higher  Black, tarry-looking stools  For urgent or emergent issues, a gastroenterologist can be reached at any hour by calling [phone].   DIET:  We do recommend a small meal at first, but then you may proceed to your regular diet.  Drink plenty of fluids but you should avoid alcoholic beverages for 24 hours.  ACTIVITY:  You should plan to take it easy for the rest of today and you should NOT DRIVE or use heavy machinery until tomorrow (because of the sedation medicines used during the test).    FOLLOW UP: Our staff will call the number listed on your records the next business day following your procedure to check on you and address any questions or concerns that you may have regarding the information given to you following your procedure. If we do not reach you, we will leave a message.  However, if you are feeling well and you are not experiencing any problems, there is no need to return our call.  We will assume that you have returned to your regular daily activities without incident.  If any biopsies were taken you will be contacted by phone or by letter within the next 1-3 weeks.  Please call us at [phone] if you have not heard about the biopsies  in 3 weeks.    SIGNATURES/CONFIDENTIALITY: You and/or your care partner have signed paperwork which will be entered into your electronic medical record.  These signatures attest to the fact that that the information above on your After Visit Summary has been reviewed and is understood.  Full responsibility of the confidentiality of this discharge information lies with you and/or your care-partner.

## 2018-03-29 NOTE — Op Note (Signed)
[place] Patient Name: Analeigha Nauman Procedure Date: 03/29/2018 3:33 PM MRN: [id number] Endoscopist: Gatha Mayer , MD Age: 47 Referring MD:  Date of Birth: April 02, 1971 Gender: Female Account #: [id number] Procedure:                Colonoscopy Indications:              Iron deficiency anemia Medicines:                Propofol per Anesthesia, Monitored Anesthesia Care Procedure:                Pre-Anesthesia Assessment:                           - Prior to the procedure, a History and Physical                            was performed, and patient medications and                            allergies were reviewed. The patient's tolerance of                            previous anesthesia was also reviewed. The risks                            and benefits of the procedure and the sedation                            options and risks were discussed with the patient.                            All questions were answered, and informed consent                            was obtained. Prior Anticoagulants: The patient has                            taken no previous anticoagulant or antiplatelet                            agents. ASA Grade Assessment: II - A patient with                            mild systemic disease. After reviewing the risks                            and benefits, the patient was deemed in                            satisfactory condition to undergo the procedure.                           After obtaining informed consent, the colonoscope  was passed under direct vision. Throughout the                            procedure, the patient's blood pressure, pulse, and                            oxygen saturations were monitored continuously. The                            Colonoscope was introduced through the anus and                            advanced to the the terminal ileum, with                            identification of the  appendiceal orifice and IC                            valve. The terminal ileum, ileocecal valve,                            appendiceal orifice, and rectum were photographed.                            The quality of the bowel preparation was excellent.                            The colonoscopy was performed without difficulty.                            The patient tolerated the procedure well. The bowel                            preparation used was Miralax. Scope In: 3:55:14 PM Scope Out: 4:04:05 PM Scope Withdrawal Time: 0 hours 6 minutes 25 seconds  Total Procedure Duration: 0 hours 8 minutes 51 seconds  Findings:                 The perianal and digital rectal examinations were                            normal.                           The colon (entire examined portion) appeared normal.                           No additional abnormalities were found on                            retroflexion. Complications:            No immediate complications. Estimated blood loss:                            None. Estimated Blood Loss:  Estimated blood loss: none. Impression:               - The entire examined colon is normal.                            Iron-deficiency anemia likely menstrual in origin.                            EGD pathology is pending                           - No specimens collected. Recommendation:           - Repeat colonoscopy in 10 years for screening                            purposes.                           - Patient has a contact number available for                            emergencies. The signs and symptoms of potential                            delayed complications were discussed with the                            patient. Return to normal activities tomorrow.                            Written discharge instructions were provided to the                            patient.                           - Resume previous diet.                            - Continue present medications. Gatha Mayer, MD 03/29/2018 4:20:41 PM This report has been signed electronically.

## 2018-03-29 NOTE — Op Note (Signed)
[place] Patient Name: Anna [name] Procedure Date: 03/29/2018 3:33 PM MRN: [id number] Endoscopist: Gatha Mayer , MD Age: 47 Referring MD:  Date of Birth: 1970/11/22 Gender: Female Account #: [id number] Procedure:                Upper GI endoscopy Indications:              Iron deficiency anemia Medicines:                Propofol per Anesthesia, Monitored Anesthesia Care Procedure:                Pre-Anesthesia Assessment:                           - Prior to the procedure, a History and Physical                            was performed, and patient medications and                            allergies were reviewed. The patient's tolerance of                            previous anesthesia was also reviewed. The risks                            and benefits of the procedure and the sedation                            options and risks were discussed with the patient.                            All questions were answered, and informed consent                            was obtained. Prior Anticoagulants: The patient has                            taken no previous anticoagulant or antiplatelet                            agents. ASA Grade Assessment: II - A patient with                            mild systemic disease. After reviewing the risks                            and benefits, the patient was deemed in                            satisfactory condition to undergo the procedure.                           After obtaining informed consent, the endoscope was  passed under direct vision. Throughout the                            procedure, the patient's blood pressure, pulse, and                            oxygen saturations were monitored continuously. The                            Model GIF-HQ190 [id number]) scope was introduced                            through the mouth, and advanced to the second part                            of  duodenum. The upper GI endoscopy was                            accomplished without difficulty. The patient                            tolerated the procedure well. Scope In: Scope Out: Findings:                 A few localized, small non-bleeding erosions were                            found in the prepyloric region of the stomach.                            There were no stigmata of recent bleeding. Biopsies                            were taken with a cold forceps for histology.                            Verification of patient identification for the                            specimen was done. Estimated blood loss was minimal.                           Multiple small sessile polyps with no stigmata of                            recent bleeding were found in the gastric body.                            Biopsies were taken with a cold forceps for                            histology. Verification of patient identification  for the specimen was done. Estimated blood loss was                            minimal.                           Diffuse ?? scalloped mucosa was found in the second                            portion of the duodenum. Biopsies were taken with a                            cold forceps for histology. Verification of patient                            identification for the specimen was done. Estimated                            blood loss was minimal.                           The exam was otherwise without abnormality.                           The cardia and gastric fundus were normal on                            retroflexion. Complications:            No immediate complications. Estimated Blood Loss:     Estimated blood loss was minimal. Impression:               - Non-bleeding erosive gastropathy. Biopsied.                           - Multiple gastric polyps. Biopsied.                           - Scalloped mucosa was found in the  duodenum, rule                            out celiac disease. Biopsied.                           - The examination was otherwise normal. Recommendation:           - Patient has a contact number available for                            emergencies. The signs and symptoms of potential                            delayed complications were discussed with the                            patient. Return to normal activities tomorrow.  Written discharge instructions were provided to the                            patient.                           - Resume previous diet.                           - Continue present medications.                           - Await pathology results.                           - See the other procedure note for documentation of                            additional recommendations. Gatha Mayer, MD 03/29/2018 4:13:02 PM This report has been signed electronically.

## 2018-03-29 NOTE — Progress Notes (Signed)
Report to PACU, RN, vss, BBS= Clear.  

## 2018-04-01 ENCOUNTER — Telehealth: Payer: Self-pay

## 2018-04-01 NOTE — Telephone Encounter (Signed)
Left message on answering machine. 

## 2018-04-01 NOTE — Telephone Encounter (Signed)
  Follow up Call-  Call back number 03/29/2018  Post procedure Call Back phone  # [phone]  Permission to leave phone message Yes  Some recent data might be hidden     Patient questions:  Do you have a fever, pain , or abdominal swelling? No. Pain Score  0 *  Have you tolerated food without any problems? Yes.    Have you been able to return to your normal activities? Yes.    Do you have any questions about your discharge instructions: Diet   No. Medications  No. Follow up visit  No.  Do you have questions or concerns about your Care? No.  Actions: * If pain score is 4 or above: No action needed, pain <4.

## 2018-04-07 NOTE — Progress Notes (Signed)
Call patient  Biopsies show stomach inflammation Ibuprofen may cause Does not have gluten allergy (celiac) She should take her pantoprazole qd Have her see Ellouise Newer in follow-up in late July  10 year colon recall no EGD recall

## 2018-04-15 ENCOUNTER — Telehealth: Payer: Self-pay

## 2018-04-15 DIAGNOSIS — R933 Abnormal findings on diagnostic imaging of other parts of digestive tract: Secondary | ICD-10-CM

## 2018-04-15 NOTE — Telephone Encounter (Signed)
Spoke with Anna [name] and told her that [facility] will contact her about setting up her MRI. Order to be  placed tomorrow after I call them during their business hours.

## 2018-04-15 NOTE — Telephone Encounter (Signed)
-----   Message from Jeoffrey Massed, RN sent at 01/14/2018  1:17 PM EDT ----- Consider confirmation with pre and post contrast abdominal MRI using hepatobiliary phase agent Eovist. This could be performed at 3 months, in order to confirm size stability.

## 2018-04-16 NOTE — Telephone Encounter (Signed)
I spoke with Manus Gunning at [facility] and she will reach out to [facility] to set this MRI up. She is aware that patient is alittle claustrophobic so will use the open machine.

## 2018-05-14 ENCOUNTER — Ambulatory Visit: Payer: BLUE CROSS/BLUE SHIELD | Admitting: Physician Assistant

## 2018-05-14 ENCOUNTER — Ambulatory Visit
Admission: RE | Admit: 2018-05-14 | Discharge: 2018-05-14 | Disposition: A | Discharge status: Home / Self Care | Payer: BLUE CROSS/BLUE SHIELD | Source: Ambulatory Visit | Attending: Internal Medicine | Admitting: Internal Medicine

## 2018-05-14 DIAGNOSIS — R933 Abnormal findings on diagnostic imaging of other parts of digestive tract: Secondary | ICD-10-CM

## 2018-05-14 MED ORDER — GADOXETATE DISODIUM 0.25 MMOL/ML IV SOLN
9.0000 mL | Freq: Once | INTRAVENOUS | Status: AC | PRN
  Administered 2018-05-14: 9 mL via INTRAVENOUS

## 2018-05-15 NOTE — Progress Notes (Signed)
Anna [name],  Let her know:  Good news - liver lesions stable - no growth and we do not think they are cancer or pre-cancerous  It is recommended that she repeat this in 6 mos by radiologist and I think that is reasonable  I do think that it would save her money if we do before the end of her insurance year - if that is 10/15/18 we should repeat in December  If it is earlier it may still make sense to repeat by that date but let me know what that date is if so and I will decide

## 2018-05-16 ENCOUNTER — Encounter: Payer: Self-pay | Admitting: Physician Assistant

## 2018-05-16 ENCOUNTER — Ambulatory Visit: Payer: BLUE CROSS/BLUE SHIELD | Admitting: Physician Assistant

## 2018-05-16 VITALS — BP 152/74 | HR 73 | Ht 63.0 in | Wt 196.0 lb

## 2018-05-16 DIAGNOSIS — D509 Iron deficiency anemia, unspecified: Secondary | ICD-10-CM

## 2018-05-16 DIAGNOSIS — K760 Fatty (change of) liver, not elsewhere classified: Secondary | ICD-10-CM | POA: Diagnosis not present

## 2018-05-16 DIAGNOSIS — K219 Gastro-esophageal reflux disease without esophagitis: Secondary | ICD-10-CM

## 2018-05-16 DIAGNOSIS — R933 Abnormal findings on diagnostic imaging of other parts of digestive tract: Secondary | ICD-10-CM | POA: Diagnosis not present

## 2018-05-16 NOTE — Patient Instructions (Signed)
You will be due for a recall MRI of the abdomen in December 2019. We will call you with an appointment when it gets closer to that time.   Continue Pantoprazole 40 mg daily.

## 2018-05-16 NOTE — Progress Notes (Addendum)
Chief Complaint: Follow-up fatty liver and abnormal liver imaging  HPI:    Anna [name] is a 47 year old Caucasian female with a past medical history as listed below, who follows with Dr. Carlean Purl returns to clinic today for follow-up of her fatty liver and abnormal liver imaging.    03/29/2018 EGD for iron deficiency anemia showed nonbleeding erosive gastropathy, multiple gastric polyps and scalloped mucosa in the duodenum.  Pathology revealed gastritis and no evidence of celiac disease.  Recommend she take pantoprazole daily.  Colonoscopy same day showed that the entire examined colon was normal.  It was thought iron deficiency anemia was likely menstrual in origin.  Repeat is recommended 10 years.    05/14/2018 MRI of the abdomen with and without contrast showed stable liver lesions favored to represent benign focal nodular hyperplasia and hemangioma the repeat was recommended in 6 months, moderate hepatic steatosis and cholelithiasis with no signs of cholecystitis.    Today, explains that she has done well since being seen last.  She does describe some occasional bloating in her abdomen and occasional epigastric discomfort, typically when she forgets to take her Pantoprazole 40 mg daily.  Denies any other new complaints today.  Does ask questions regarding recent imaging of her liver.    Denies fever, chills, weight loss, change in bowel habits, heartburn or reflux.  Past Medical History:  Diagnosis Date  . Allergy   . Cancer ([place])   . Endometriosis    Diagnosed at age 12. Followed by Gynecology -- Dr. Radene Knee  . Gallstones   . GERD (gastroesophageal reflux disease)   . Heart murmur   . Iron deficiency anemia    on OTC supplement    Past Surgical History:  Procedure Laterality Date  . ABLATION ON ENDOMETRIOSIS  1997  . COLONOSCOPY  03/2018  . deviated setum  1986  . ESOPHAGOGASTRODUODENOSCOPY  03/2018    Current Outpatient Medications  Medication Sig Dispense Refill  . ferrous  sulfate 325 (65 FE) MG tablet Take 325 mg by mouth daily with breakfast.    . ibuprofen (ADVIL,MOTRIN) 200 MG tablet Take 200 mg by mouth every 6 (six) hours as needed.    . pantoprazole (PROTONIX) 40 MG tablet Take 1 tablet (40 mg total) by mouth daily. Take 30 minutes to one hour before breakfast and after dinner. 30 tablet 3  . ZOVIA 1/50E, 28, 1-50 MG-MCG tablet TK 1 T PO D  6   Current Facility-Administered Medications  Medication Dose Route Frequency Provider Last Rate Last Dose  . 0.9 %  sodium chloride infusion  500 mL Intravenous Once Gatha Mayer, MD        Allergies as of 05/16/2018 - Review Complete 05/16/2018  Allergen Reaction Noted  . Erythromycin Rash 04/29/2016  . Nitrofurantoin Rash 04/29/2016    Family History  Problem Relation Age of Onset  . Hypertension Mother   . Diabetes Mother   . Heart attack Father 68  . Lung disease Father   . Hypertension Father   . Hepatitis C Maternal Uncle   . Lung disease Paternal Uncle   . Emphysema Maternal Grandmother   . Food Allergy Daughter   . Asthma Daughter   . Colon cancer Neg Hx   . Esophageal cancer Neg Hx   . Liver cancer Neg Hx   . Pancreatic cancer Neg Hx   . Stomach cancer Neg Hx   . Rectal cancer Neg Hx     Social History   Socioeconomic History  .  Marital status: Married    Spouse name: Not on file  . Number of children: Not on file  . Years of education: Not on file  . Highest education level: Not on file  Occupational History  . Not on file  Social Needs  . Financial resource strain: Not on file  . Food insecurity:    Worry: Not on file    Inability: Not on file  . Transportation needs:    Medical: Not on file    Non-medical: Not on file  Tobacco Use  . Smoking status: Never Smoker  . Smokeless tobacco: Never Used  Substance and Sexual Activity  . Alcohol use: No  . Drug use: No  . Sexual activity: Yes    Birth control/protection: Pill  Lifestyle  . Physical activity:    Days per  week: Not on file    Minutes per session: Not on file  . Stress: Not on file  Relationships  . Social connections:    Talks on phone: Not on file    Gets together: Not on file    Attends religious service: Not on file    Active member of club or organization: Not on file    Attends meetings of clubs or organizations: Not on file    Relationship status: Not on file  . Intimate partner violence:    Fear of current or ex partner: Not on file    Emotionally abused: Not on file    Physically abused: Not on file    Forced sexual activity: Not on file  Other Topics Concern  . Not on file  Social History Narrative  . Not on file    Review of Systems:    Constitutional: No weight loss, fever or chills Cardiovascular: No chest pain Respiratory: No SOB Gastrointestinal: See HPI and otherwise negative   Physical Exam:  Vital signs: BP (!) 152/74   Pulse 73   Ht 5\' 3"  (1.6 m)   Wt 196 lb (88.9 kg)   BMI 34.72 kg/m   Constitutional:   Pleasant overweight Caucasian female appears to be in NAD, Well developed, Well nourished, alert and cooperative Respiratory: Respirations even and unlabored. Lungs clear to auscultation bilaterally.   No wheezes, crackles, or rhonchi.  Cardiovascular: Normal S1, S2. No MRG. Regular rate and rhythm. No peripheral edema, cyanosis or pallor.  Gastrointestinal:  Soft, nondistended, nontender. No rebound or guarding. Normal bowel sounds. No appreciable masses or hepatomegaly. Psychiatric: Demonstrates good judgement and reason without abnormal affect or behaviors.  No recent labs or imaging.   Assessment: 1.  Fatty liver: Moderate per recent MRI 2.  Abnormal imaging liver: Recent MRI reveals likely hemangiomas in patient's liver, repeat recommended in 6 months 3.  IDA: EGD and colonoscopy unrevealing, thought to be from Gyn source, no recent labs since March, patient needs to see PCP and follow with them going forward 4.  Gastritis: Seen at time of EGD,  patient is doing well on Pantoprazole 40 mg daily  Plan: 1.  Patient will need to follow with her PCP in regards to her anemia in the future.  This is felt most likely from a GYN source.  We will make sure primary care physician knows to follow with patient. 2.  Patient to continue Pantoprazole 40 mg daily, 30-60 minutes before breakfast. 3.  Reviewed recent MRI of the liver.  This was reassuring.  Recommendations are for repeat in 6 months, will repeat at the end of the year for the  patient. 4.  Patient to follow in clinic as needed with Dr. Carlean Purl or myself.  Ellouise Newer, PA-C [facility] 06/18/2018, 8:23 AM  Cc: Brunetta Jeans, PA-C   Agree with Ms. Giles Currie's evaluation and management.  Gatha Mayer, MD, [facility]

## 2018-05-21 ENCOUNTER — Ambulatory Visit: Payer: BLUE CROSS/BLUE SHIELD | Admitting: Physician Assistant

## 2018-05-21 ENCOUNTER — Encounter: Payer: Self-pay | Admitting: Physician Assistant

## 2018-05-21 ENCOUNTER — Other Ambulatory Visit: Payer: Self-pay

## 2018-05-21 VITALS — BP 136/88 | HR 100 | Temp 98.3°F | Resp 17 | Ht 63.0 in | Wt 197.6 lb

## 2018-05-21 DIAGNOSIS — R222 Localized swelling, mass and lump, trunk: Secondary | ICD-10-CM

## 2018-05-21 MED ORDER — CEPHALEXIN 500 MG PO CAPS: 500.0000 mg | ORAL_CAPSULE | Freq: Two times a day (BID) | ORAL | 0 refills | Status: AC

## 2018-05-21 NOTE — Patient Instructions (Signed)
Please take antibiotic as directed. Avoid touching the area. I have placed the referral for you to general surgery for further assessment.

## 2018-05-21 NOTE — Progress Notes (Signed)
Patient presents to clinic today c/o mass of her upper R back/neck x 6 days. Notes is sometimes tender. Has not gotten bigger in size since first occurrence. Denies drainage from the area. Denies trauma or injury. Denies fever, chills, malaise or fatigue.   Past Medical History:  Diagnosis Date  . Allergy   . Cancer ([place])   . Endometriosis    Diagnosed at age 47. Followed by Gynecology -- Dr. Radene Knee  . Gallstones   . GERD (gastroesophageal reflux disease)   . Heart murmur   . Iron deficiency anemia    on OTC supplement    Current Outpatient Medications on File Prior to Visit  Medication Sig Dispense Refill  . ferrous sulfate 325 (65 FE) MG tablet Take 325 mg by mouth daily with breakfast.    . ibuprofen (ADVIL,MOTRIN) 200 MG tablet Take 200 mg by mouth every 6 (six) hours as needed.    . pantoprazole (PROTONIX) 40 MG tablet Take 1 tablet (40 mg total) by mouth daily. Take 30 minutes to one hour before breakfast and after dinner. 30 tablet 3  . ZOVIA 1/50E, 28, 1-50 MG-MCG tablet TK 1 T PO D  6   Current Facility-Administered Medications on File Prior to Visit  Medication Dose Route Frequency Provider Last Rate Last Dose  . 0.9 %  sodium chloride infusion  500 mL Intravenous Once Gatha Mayer, MD        Allergies  Allergen Reactions  . Erythromycin Rash  . Nitrofurantoin Rash    Family History  Problem Relation Age of Onset  . Hypertension Mother   . Diabetes Mother   . Heart attack Father 74  . Lung disease Father   . Hypertension Father   . Hepatitis C Maternal Uncle   . Lung disease Paternal Uncle   . Emphysema Maternal Grandmother   . Food Allergy Daughter   . Asthma Daughter   . Colon cancer Neg Hx   . Esophageal cancer Neg Hx   . Liver cancer Neg Hx   . Pancreatic cancer Neg Hx   . Stomach cancer Neg Hx   . Rectal cancer Neg Hx     Social History   Socioeconomic History  . Marital status: Married    Spouse name: Not on file  . Number of children:  Not on file  . Years of education: Not on file  . Highest education level: Not on file  Occupational History  . Not on file  Social Needs  . Financial resource strain: Not on file  . Food insecurity:    Worry: Not on file    Inability: Not on file  . Transportation needs:    Medical: Not on file    Non-medical: Not on file  Tobacco Use  . Smoking status: Never Smoker  . Smokeless tobacco: Never Used  Substance and Sexual Activity  . Alcohol use: No  . Drug use: No  . Sexual activity: Yes    Birth control/protection: Pill  Lifestyle  . Physical activity:    Days per week: Not on file    Minutes per session: Not on file  . Stress: Not on file  Relationships  . Social connections:    Talks on phone: Not on file    Gets together: Not on file    Attends religious service: Not on file    Active member of club or organization: Not on file    Attends meetings of clubs or organizations: Not on file  Relationship status: Not on file  Other Topics Concern  . Not on file  Social History Narrative  . Not on file   Review of Systems - See HPI.  All other ROS are negative.  Ht 5\' 3"  (1.6 m)   BMI 34.72 kg/m   Physical Exam  Constitutional: She appears well-developed and well-nourished.  HENT:  Head: Normocephalic and atraumatic.  Eyes: Conjunctivae are normal.  Neck: Neck supple.  Cardiovascular: Normal rate, regular rhythm, normal heart sounds and intact distal pulses.  Pulmonary/Chest: Effort normal.  Skin:     Psychiatric: She has a normal mood and affect.  Vitals reviewed.   Assessment/Plan: 1. Mass of subcutaneous tissue of back Start Keflex due to concern for developing infection of cyst/mass. Referral placed to General Surgery for further assessment and removal. - cephALEXin (KEFLEX) 500 MG capsule; Take 1 capsule (500 mg total) by mouth 2 (two) times daily for 7 days.  Dispense: 14 capsule; Refill: 0 - Ambulatory referral to [place], PA-C

## 2018-06-18 ENCOUNTER — Encounter: Payer: Self-pay | Admitting: Physician Assistant

## 2018-07-12 ENCOUNTER — Other Ambulatory Visit: Payer: Self-pay | Admitting: General Surgery

## 2018-08-07 ENCOUNTER — Ambulatory Visit: Payer: BLUE CROSS/BLUE SHIELD | Admitting: Family Medicine

## 2018-08-26 ENCOUNTER — Other Ambulatory Visit: Payer: Self-pay | Admitting: Physician Assistant

## 2018-09-19 ENCOUNTER — Telehealth: Payer: Self-pay

## 2018-09-19 DIAGNOSIS — R933 Abnormal findings on diagnostic imaging of other parts of digestive tract: Secondary | ICD-10-CM

## 2018-09-19 DIAGNOSIS — K760 Fatty (change of) liver, not elsewhere classified: Secondary | ICD-10-CM

## 2018-09-19 NOTE — Telephone Encounter (Signed)
Patient notified of the MRI scheduled for [facility] on 10/11/18 9:30 arrival for 10.  She is notified to be NPO after midnight.

## 2018-09-19 NOTE — Telephone Encounter (Signed)
Left message for patient to call back  

## 2018-09-19 NOTE — Telephone Encounter (Signed)
Patient is available to have MRI on 10/03/18 and 10/11/18

## 2018-09-19 NOTE — Telephone Encounter (Signed)
-----   Message from Marlon Pel, RN sent at 05/16/2018 11:18 AM EDT ----- See results 05/15/18 MRI Tues and Thursdays are best days Needs repeat MRI before 10/15/18- Carlean Purl

## 2018-09-19 NOTE — Addendum Note (Signed)
Addended by: Marlon Pel on: 09/19/2018 03:39 PM   Modules accepted: Orders

## 2018-10-11 ENCOUNTER — Ambulatory Visit (HOSPITAL_COMMUNITY): Payer: BLUE CROSS/BLUE SHIELD

## 2018-10-12 ENCOUNTER — Ambulatory Visit (HOSPITAL_COMMUNITY)
Admission: RE | Admit: 2018-10-12 | Discharge: 2018-10-12 | Disposition: A | Discharge status: Home / Self Care | Payer: BLUE CROSS/BLUE SHIELD | Source: Ambulatory Visit | Attending: Internal Medicine | Admitting: Internal Medicine

## 2018-10-12 DIAGNOSIS — R933 Abnormal findings on diagnostic imaging of other parts of digestive tract: Secondary | ICD-10-CM | POA: Diagnosis not present

## 2018-10-12 DIAGNOSIS — K769 Liver disease, unspecified: Secondary | ICD-10-CM | POA: Insufficient documentation

## 2018-10-12 DIAGNOSIS — K8020 Calculus of gallbladder without cholecystitis without obstruction: Secondary | ICD-10-CM | POA: Diagnosis not present

## 2018-10-12 DIAGNOSIS — K760 Fatty (change of) liver, not elsewhere classified: Secondary | ICD-10-CM | POA: Diagnosis present

## 2018-10-12 MED ORDER — GADOXETATE DISODIUM 0.25 MMOL/ML IV SOLN
10.0000 mL | Freq: Once | INTRAVENOUS | Status: AC | PRN
  Administered 2018-10-12: 9 mL via INTRAVENOUS

## 2018-10-14 ENCOUNTER — Telehealth: Payer: Self-pay | Admitting: Physician Assistant

## 2018-10-14 NOTE — Telephone Encounter (Signed)
Anna [name] it looks like you ordered this MRI for Dr Carlean Purl.

## 2018-10-14 NOTE — Telephone Encounter (Signed)
Please see results notes for additional details

## 2018-10-14 NOTE — Telephone Encounter (Signed)
Pt calling inquiring about MRI results.

## 2018-10-14 NOTE — Progress Notes (Signed)
Let patient know the reading of this MRI suggests she has hepatic adenomas, not focal nodular hyperplasia as was indicated last 2 times.  So I want to review with the radiologists when I can - might take until next week  No urgent health issue here but could change management but I need to review as I stated above and will contact her

## 2018-10-22 NOTE — Progress Notes (Signed)
Please let her know that I reviewed the MR images  the appearance of the liver lesions is different and now suggests they could be hepatic adenomas - the last reading doctor said diagnostic of that BUT the radiologist I reviewed with said more of a possibility  That said a hepatic adenoma is precancerous and sometimes needs to be removed   Birth control pills can make them grow so it would be best she stop those - she should coordinate with Dr. Radene Knee and we need to fax Dr. Radene Knee a copy of this MR report and these notes  At this point she needs to be seen and followed (not necessarily operated on) by a liver surgeon.  That would mean [facility], [facility] or [facility] - if she has a preference we can honor that If she wants close - [facility] has appointments at [facility]  She should get copies of her MR discs so they can see them

## 2018-10-28 DIAGNOSIS — R160 Hepatomegaly, not elsewhere classified: Secondary | ICD-10-CM | POA: Insufficient documentation

## 2019-02-07 ENCOUNTER — Telehealth: Payer: Self-pay | Admitting: Emergency Medicine

## 2019-02-07 NOTE — Telephone Encounter (Signed)
LMOVM advising patient to call back to schedule yearly physical and video visit for any upcoming problems or concerns.

## 2019-10-04 IMAGING — US US ABDOMEN LIMITED
1 series · 13 of 25 positions shown · non-contrast
Comparison: 02/26/2013 CT.

CLINICAL DATA: 47-year-old female. Multiple hemangiomas. Initial
encounter.

EXAM:
ULTRASOUND ABDOMEN LIMITED RIGHT UPPER QUADRANT

[Series 1: us abdomen limited · 0.22mm/px · 13 of 82 slices shown]
[im 1/82]
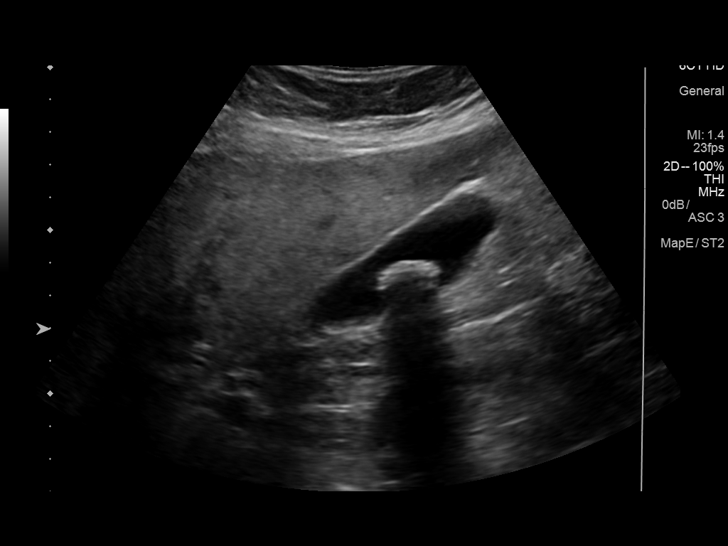
[im 7/82]
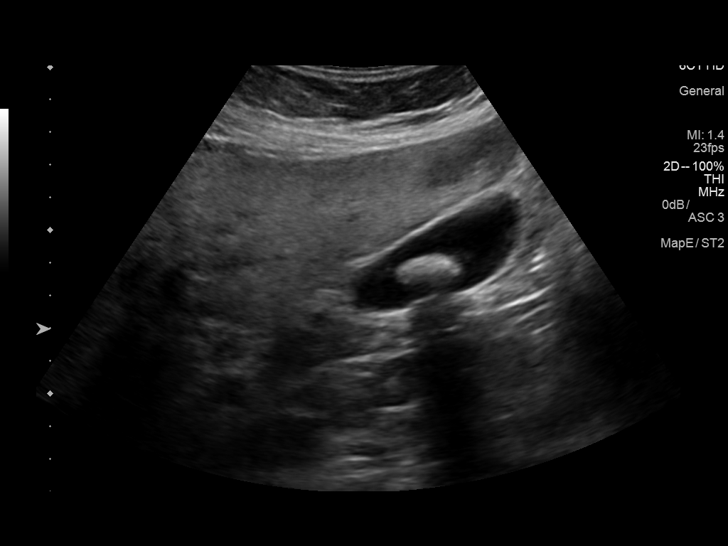
[im 14/82]
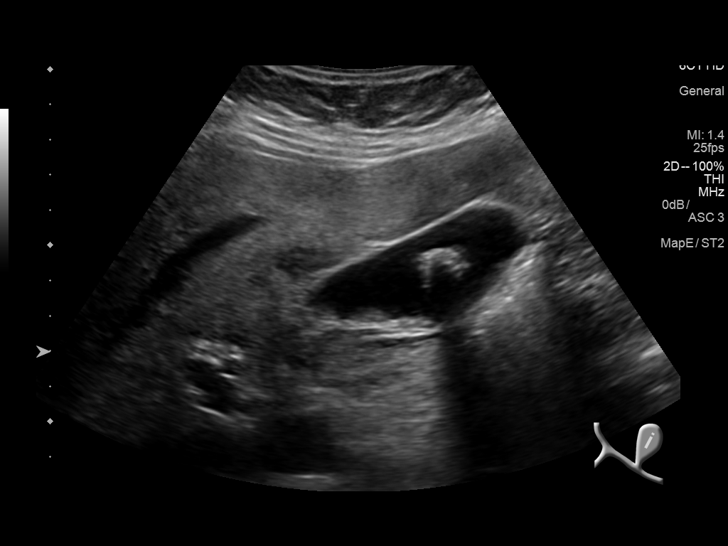
[im 21/82]
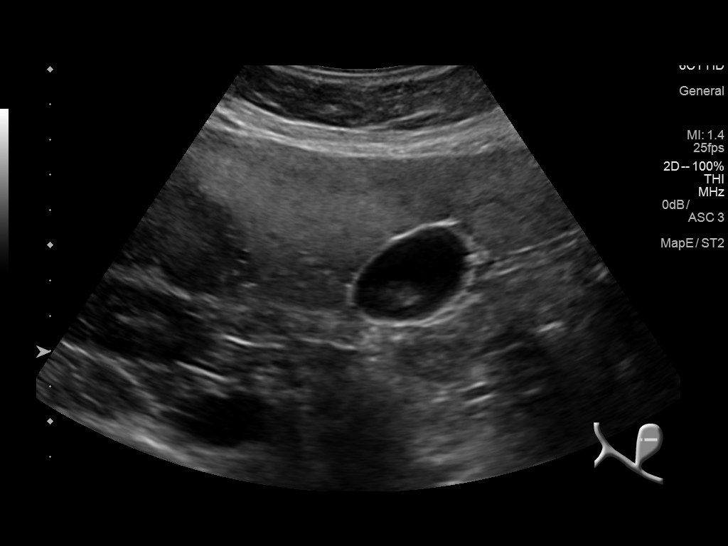
[im 28/82]
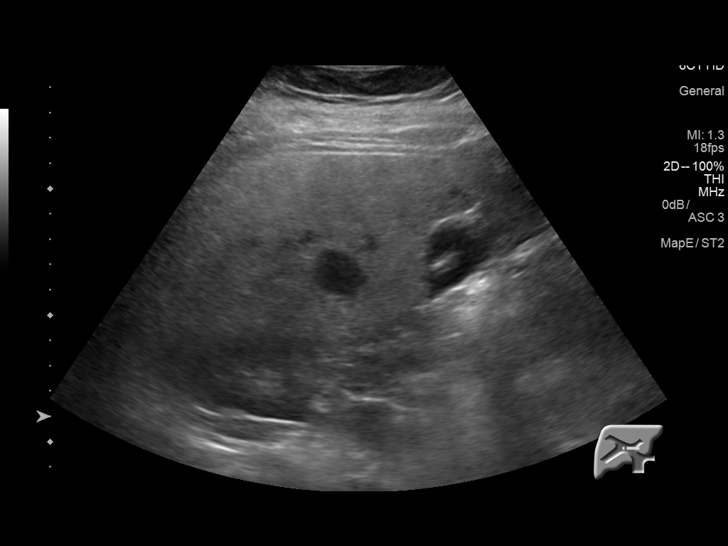
[im 34/82]
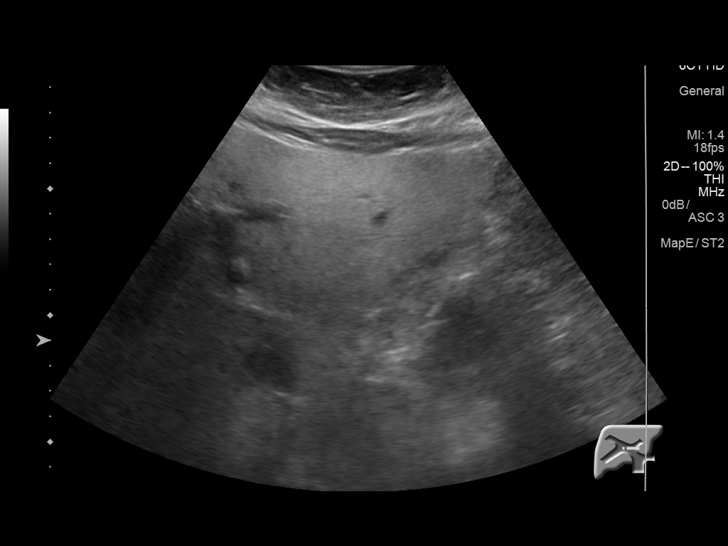
[im 41/82]
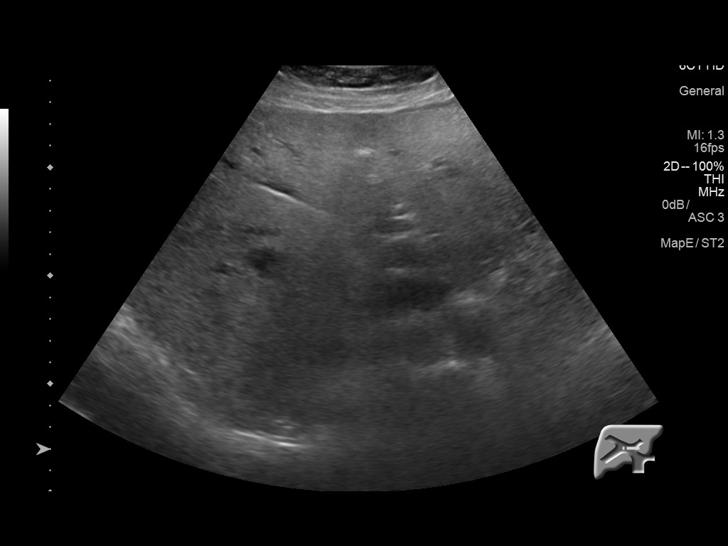
[im 48/82]
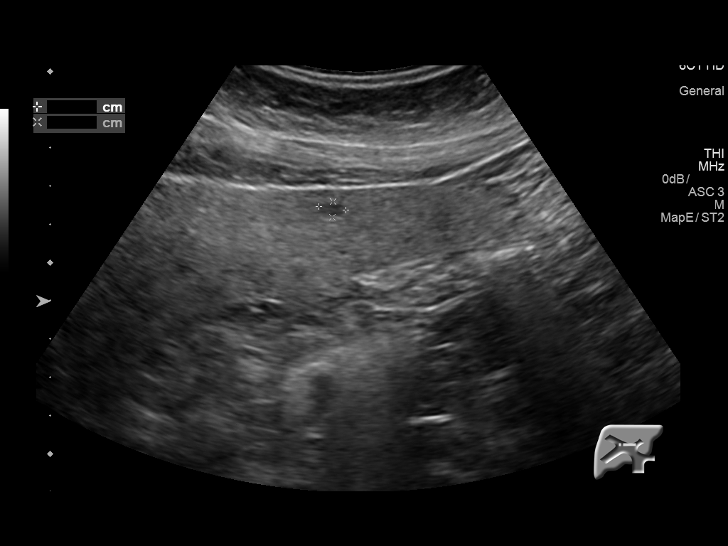
[im 55/82]
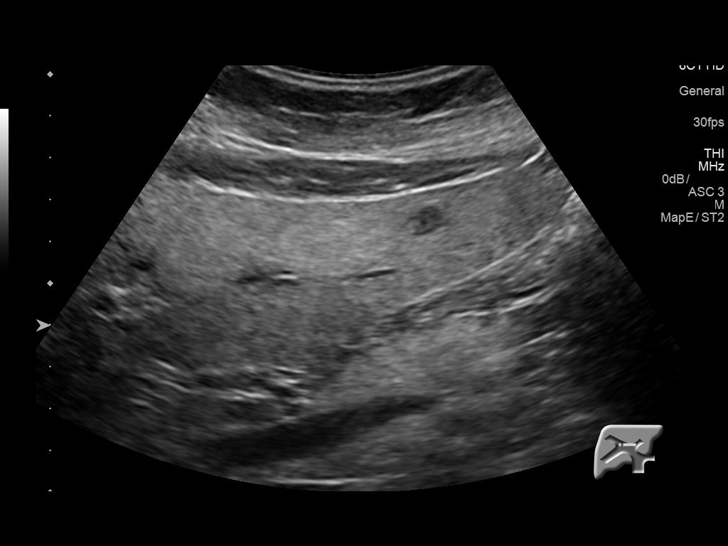
[im 61/82]
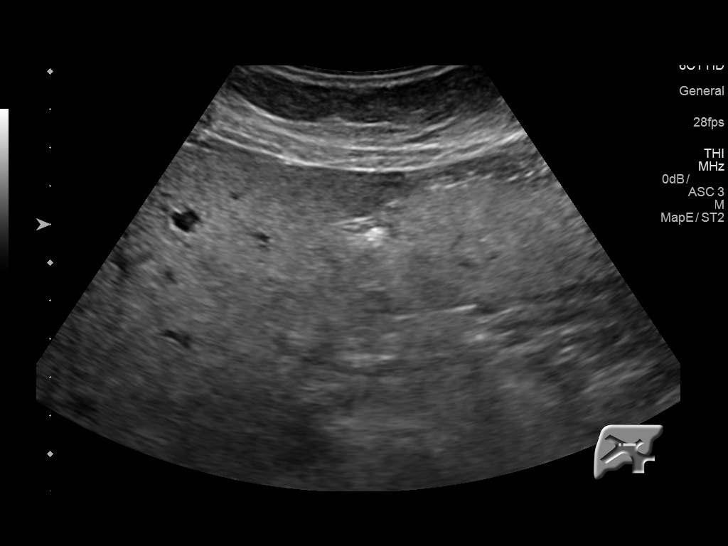
[im 68/82]
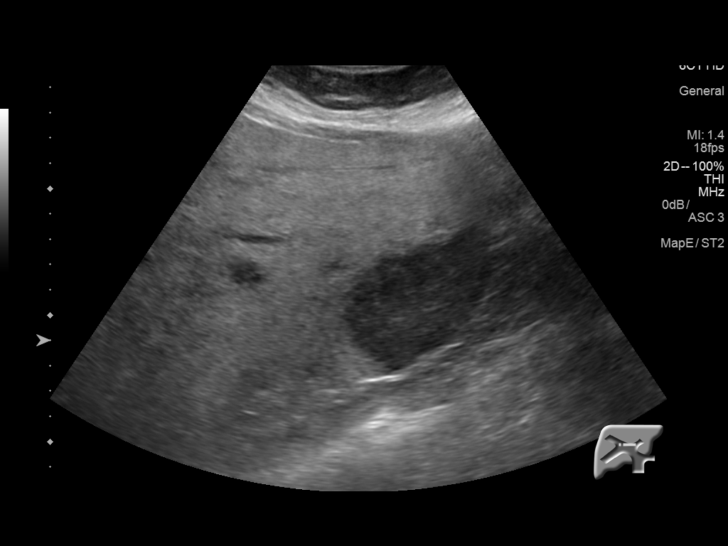
[im 75/82]
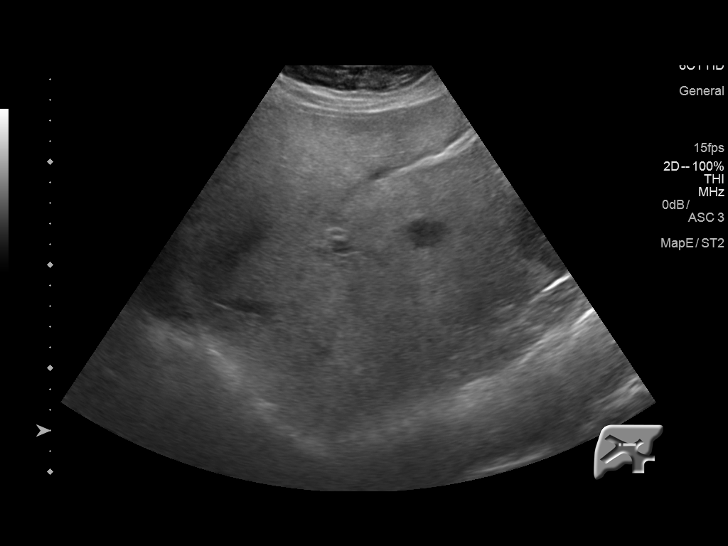
[im 82/82]
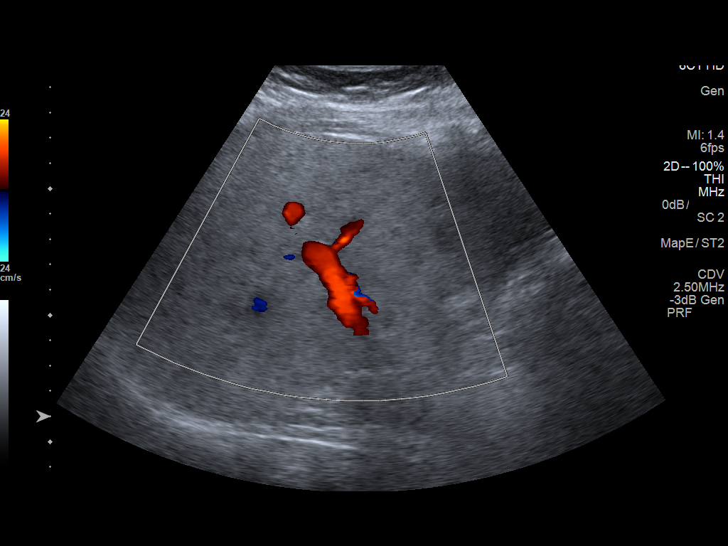

[13 of 25 positions shown; findings below may reference images not displayed]

FINDINGS: Gallbladder:

2 cm mobile gallstone. No gallbladder wall thickening or
pericholecystic fluid. Patient was not tender over this region
during scanning per sonographer.

Common bile duct:

Diameter: 2.6 mm

Liver:

Liver of increased echogenicity consistent with fatty infiltration
and/or hepatocellular disease. Several hypoechoic structures
scattered throughout the liver 3 largest measuring up to 2.4 cm,
cm in 6.8 cm. Portal vein is patent on color Doppler imaging with
normal direction of blood flow towards the liver.
IMPRESSION: Liver of increased echogenicity consistent with fatty infiltration
and/or hepatocellular disease. Several hypoechoic structures
scattered throughout the liver largest measuring up to 6.8 cm. These
cannot be confirmed as hemangiomas based on ultrasound
characteristics. Patient would benefit from dedicated liver MR for
further delineation.

2 cm gallstone without evidence of gallbladder inflammation.

These results will be called to the ordering clinician or
representative by the Radiologist Assistant, and communication
documented in the PACS or zVision Dashboard.

## 2020-05-12 ENCOUNTER — Ambulatory Visit: Payer: BLUE CROSS/BLUE SHIELD | Admitting: Physician Assistant

## 2020-05-14 DIAGNOSIS — I10 Essential (primary) hypertension: Secondary | ICD-10-CM | POA: Insufficient documentation

## 2020-05-18 ENCOUNTER — Other Ambulatory Visit: Payer: Self-pay

## 2020-05-18 ENCOUNTER — Encounter: Payer: Self-pay | Admitting: Physician Assistant

## 2020-05-18 ENCOUNTER — Ambulatory Visit (INDEPENDENT_AMBULATORY_CARE_PROVIDER_SITE_OTHER): Payer: 59 | Admitting: Physician Assistant

## 2020-05-18 VITALS — BP 144/88 | HR 89 | Temp 97.5°F | Resp 16 | Wt 203.0 lb

## 2020-05-18 DIAGNOSIS — R918 Other nonspecific abnormal finding of lung field: Secondary | ICD-10-CM | POA: Diagnosis not present

## 2020-05-18 DIAGNOSIS — I10 Essential (primary) hypertension: Secondary | ICD-10-CM

## 2020-05-18 DIAGNOSIS — N83201 Unspecified ovarian cyst, right side: Secondary | ICD-10-CM | POA: Diagnosis not present

## 2020-05-18 DIAGNOSIS — R319 Hematuria, unspecified: Secondary | ICD-10-CM

## 2020-05-18 DIAGNOSIS — R160 Hepatomegaly, not elsewhere classified: Secondary | ICD-10-CM

## 2020-05-18 DIAGNOSIS — K760 Fatty (change of) liver, not elsewhere classified: Secondary | ICD-10-CM

## 2020-05-18 LAB — POCT URINALYSIS DIPSTICK
Bilirubin, UA: NEGATIVE
Blood, UA: NEGATIVE
Glucose, UA: NEGATIVE
Ketones, UA: NEGATIVE
Leukocytes, UA: NEGATIVE
Nitrite, UA: NEGATIVE
Protein, UA: NEGATIVE
Spec Grav, UA: 1.02 (ref 1.010–1.025)
Urobilinogen, UA: 0.2 E.U./dL
pH, UA: 5.5 (ref 5.0–8.0)

## 2020-05-18 LAB — COMPREHENSIVE METABOLIC PANEL
ALT: 12 U/L (ref 0–35)
AST: 11 U/L (ref 0–37)
Albumin: 4 g/dL (ref 3.5–5.2)
Alkaline Phosphatase: 88 U/L (ref 39–117)
BUN: 12 mg/dL (ref 6–23)
CO2: 28 mEq/L (ref 19–32)
Calcium: 9.7 mg/dL (ref 8.4–10.5)
Chloride: 101 mEq/L (ref 96–112)
Creatinine, Ser: 0.5 mg/dL (ref 0.40–1.20)
GFR: 130.32 mL/min (ref >60.00)
Glucose, Bld: 95 mg/dL (ref 70–99)
Potassium: 4.3 mEq/L (ref 3.5–5.1)
Sodium: 134 mEq/L — ABNORMAL LOW (ref 135–145)
Total Bilirubin: 0.4 mg/dL (ref 0.2–1.2)
Total Protein: 7 g/dL (ref 6.0–8.3)

## 2020-05-18 LAB — CBC WITH DIFFERENTIAL/PLATELET
Basophils Absolute: 0.1 10*3/uL (ref 0.0–0.1)
Basophils Relative: 1.1 % (ref 0.0–3.0)
Eosinophils Absolute: 0.2 10*3/uL (ref 0.0–0.7)
Eosinophils Relative: 1.5 % (ref 0.0–5.0)
HCT: 36.4 % (ref 36.0–46.0)
Hemoglobin: 11.8 g/dL — ABNORMAL LOW (ref 12.0–15.0)
Lymphocytes Relative: 14.7 % (ref 12.0–46.0)
Lymphs Abs: 1.6 10*3/uL (ref 0.7–4.0)
MCHC: 32.4 g/dL (ref 30.0–36.0)
MCV: 78.2 fl (ref 78.0–100.0)
Monocytes Absolute: 0.6 10*3/uL (ref 0.1–1.0)
Monocytes Relative: 5.2 % (ref 3.0–12.0)
Neutro Abs: 8.6 10*3/uL — ABNORMAL HIGH (ref 1.4–7.7)
Neutrophils Relative %: 77.5 % — ABNORMAL HIGH (ref 43.0–77.0)
Platelets: 356 10*3/uL (ref 150.0–400.0)
RBC: 4.65 Mil/uL (ref 3.87–5.11)
RDW: 14.4 % (ref 11.5–15.5)
WBC: 11.2 10*3/uL — ABNORMAL HIGH (ref 4.0–10.5)

## 2020-05-18 MED ORDER — LOSARTAN POTASSIUM 25 MG PO TABS: 25.0000 mg | ORAL_TABLET | Freq: Every day | ORAL | 1 refills | Status: DC

## 2020-05-18 NOTE — Progress Notes (Signed)
Patient presents to clinic today for ER follow-up regarding hypertensive urgency.  Patient without prior history of hypertension.  Has not been seen by this practice in around 2 years.  Patient presented to [facility] emergency department in [place], [place] on 05/11/2020 with complaints of some atypical chest pain present x1 week.  ER work-up included EKG (sinus tachycardia with rate of 120 bpm), troponin (negative), D-dimer (negative), CMP (unremarkable) and CXR (mild bibasilar atelectasis or infiltrate). CT Chest was obtained giving questionable CXR findings. Revealed no acute process.  Did reveal small right lower lobe pulmonary nodules measuring up to 0.5 cm.  Also some changes associated with NAFLD. CT Abdomen obtained and revealed hepatomegaly with fatty infiltration of liver and scattered as well as discrete areas of fatty sparing.  Also revealed a right ovarian cyst measuring 2.7 cm with recommendation of repeat ultrasound in 2 months to document resolution. BP noted to be elevated as high as 213/90.  Patient was given IV beta-blocker with improvement in blood pressure down to 295 systolic.  No active chest pain and negative work-up.  As such patient was discharged to follow-up with primary care.  Since discharge, patient notes she contacted a local cardiologist and had an appointment on 05/14/2020.  Records are available in care everywhere and have been reviewed.  At this visit BP was still noted to be elevated.  As such patient was started on carvedilol 6.25 mg twice daily.  Was also recommend she be set up for stress test, echocardiogram and sleep study.  Patient is scheduled for echocardiogram this Thursday.  Other test dates are still pending.  Patient states since his visit she has been taking her medication as directed.  Has tolerated well overall but has noted fatigue with medication.  Denies any recurrent episodes of chest pain.  Patient denies any racing heart, lightheadedness  or dizziness.  Denies any frequent headache or vision change.  Has made substantial changes to her diet cutting out all sodas and trying to limit salt.  Is waiting for her procedures before she starts an exercise regimen.  Past Medical History:  Diagnosis Date  . Allergy   . Cancer ([place])   . Endometriosis    Diagnosed at age 55. Followed by Gynecology -- Dr. Radene Knee  . Gallstones   . GERD (gastroesophageal reflux disease)   . Heart murmur   . Iron deficiency anemia    on OTC supplement    Current Outpatient Medications on File Prior to Visit  Medication Sig Dispense Refill  . carvedilol (COREG) 6.25 MG tablet Take by mouth.    . cholecalciferol (VITAMIN D3) 25 MCG (1000 UNIT) tablet Take 1,000 Units by mouth daily.    . ferrous sulfate 325 (65 FE) MG tablet Take 325 mg by mouth daily with breakfast.     No current facility-administered medications on file prior to visit.    Allergies  Allergen Reactions  . Erythromycin Rash  . Nitrofurantoin Rash    Family History  Problem Relation Age of Onset  . Hypertension Mother   . Diabetes Mother   . Heart attack Father 45  . Lung disease Father   . Hypertension Father   . Hepatitis C Maternal Uncle   . Lung disease Paternal Uncle   . Emphysema Maternal Grandmother   . Food Allergy Daughter   . Asthma Daughter   . Colon cancer Neg Hx   . Esophageal cancer Neg Hx   . Liver cancer Neg Hx   .  Pancreatic cancer Neg Hx   . Stomach cancer Neg Hx   . Rectal cancer Neg Hx     Social History   Socioeconomic History  . Marital status: Married    Spouse name: Not on file  . Number of children: Not on file  . Years of education: Not on file  . Highest education level: Not on file  Occupational History  . Not on file  Tobacco Use  . Smoking status: Never Smoker  . Smokeless tobacco: Never Used  Vaping Use  . Vaping Use: Never used  Substance and Sexual Activity  . Alcohol use: No  . Drug use: No  . Sexual activity: Yes    Other Topics Concern  . Not on file  Social History Narrative  . Not on file   Social Determinants of Health   Financial Resource Strain:   . Difficulty of Paying Living Expenses:   Food Insecurity:   . Worried About Charity fundraiser in the Last Year:   . Arboriculturist in the Last Year:   Transportation Needs:   . Film/video editor (Medical):   Marland Kitchen Lack of Transportation (Non-Medical):   Physical Activity:   . Days of Exercise per Week:   . Minutes of Exercise per Session:   Stress:   . Feeling of Stress :   Social Connections:   . Frequency of Communication with Friends and Family:   . Frequency of Social Gatherings with Friends and Family:   . Attends Religious Services:   . Active Member of Clubs or Organizations:   . Attends Archivist Meetings:   Marland Kitchen Marital Status:    Review of Systems - See HPI.  All other ROS are negative.  Pulse 89   Temp (!) 97.5 F (36.4 C) (Temporal)   Resp 16   Wt 203 lb (92.1 kg)   BMI 35.96 kg/m   Physical Exam Constitutional:      Appearance: Normal appearance.  HENT:     Head: Normocephalic and atraumatic.     Right Ear: Tympanic membrane normal.     Left Ear: Tympanic membrane normal.  Eyes:     Pupils: Pupils are equal, round, and reactive to light.  Cardiovascular:     Rate and Rhythm: Normal rate and regular rhythm.     Pulses: Normal pulses.     Heart sounds: Normal heart sounds.  Pulmonary:     Effort: Pulmonary effort is normal.     Breath sounds: Normal breath sounds.  Musculoskeletal:     Cervical back: Neck supple.  Neurological:     General: No focal deficit present.     Mental Status: She is alert and oriented to person, place, and time.  Psychiatric:        Mood and Affect: Mood normal.    Assessment/Plan: 1. Essential hypertension Asymptomatic at present.  BP improving.  Discussed with her that the fatigue from carvedilol should resolve as her body becomes adjusted to the medication.  BP  is still above goal and given current fatigue do not want to increase carvedilol at present.  We will add on losartan 25 mg once daily.  We will repeat labs today.  She is to follow-up with cardiology as scheduled, getting her echocardiogram this Thursday. - CBC with Differential/Platelet - Comprehensive metabolic panel  2. Hematuria, unspecified type Noted on her ER labs on second review.  Patient recently had a myomectomy but denied any continued visible bleeding.  Denies any urinary symptoms.  Repeat UA today is unremarkable.  No further work-up needed. - POCT urinalysis dipstick  3. Pulmonary nodules Multiple pulmonary nodules 5 mm or less noted on CT.  Patient has never been a smoker.  Will obtain repeat CT in 1 year.  If stable no further work-up needed.  4. Right ovarian cyst We will obtain repeat ultrasound in 2 months to ensure resolution per radiologist recommendations.  Reminder placed in system.  5. Fatty liver disease, nonalcoholic Repeat CMP today.  Given hepatomegaly also noted would recommend she be followed by GI. Referral placed.   This visit occurred during the SARS-CoV-2 public health emergency.  Safety protocols were in place, including screening questions prior to the visit, additional usage of staff PPE, and extensive cleaning of exam room while observing appropriate contact time as indicated for disinfecting solutions.     Leeanne Rio, PA-C

## 2020-05-18 NOTE — Patient Instructions (Signed)
Please go to the lab today for blood work.  I will call you with your results. We will alter treatment regimen(s) if indicated by your results.  Please continue the Cavedilol as directed. The fatigue should go away as your body adjusts to the medicine. I am starting you on low dose of losartan (25 mg once daily).  Take as directed.  Continue low salt diet. Follow-up with Cardiologist as scheduled, having your echo later this week.  If you note any new symptoms or recurrent of pain, please let us know.  Hang in there

## 2020-05-19 ENCOUNTER — Other Ambulatory Visit (INDEPENDENT_AMBULATORY_CARE_PROVIDER_SITE_OTHER): Payer: 59

## 2020-05-19 DIAGNOSIS — D649 Anemia, unspecified: Secondary | ICD-10-CM

## 2020-05-19 LAB — IBC PANEL
Iron: 48 ug/dL (ref 42–145)
Saturation Ratios: 9.8 % — ABNORMAL LOW (ref 20.0–50.0)
Transferrin: 351 mg/dL (ref 212.0–360.0)

## 2020-05-20 ENCOUNTER — Other Ambulatory Visit: Payer: Self-pay | Admitting: Emergency Medicine

## 2020-05-20 DIAGNOSIS — D509 Iron deficiency anemia, unspecified: Secondary | ICD-10-CM

## 2020-05-20 MED ORDER — FERROUS SULFATE 325 (65 FE) MG PO TABS
325.0000 mg | ORAL_TABLET | Freq: Two times a day (BID) | ORAL | 3 refills | Status: AC
Start: 2020-05-20 — End: ?

## 2020-06-29 ENCOUNTER — Telehealth: Payer: Self-pay | Admitting: Physician Assistant

## 2020-06-29 NOTE — Telephone Encounter (Signed)
FYI, spoke with patient and informed her that we did not draw a lipid panel with her last set of labs. Patient voiced understanding and states she will have "[facility] draw it at her next visit.

## 2020-06-29 NOTE — Telephone Encounter (Signed)
Patient would like to know if you ran a lipid panel on her - Her doctor Arvella Nigh - [facility]  [facility] needs the results - Please advise

## 2020-07-05 ENCOUNTER — Encounter (HOSPITAL_COMMUNITY): Payer: Self-pay | Admitting: *Deleted

## 2020-07-05 ENCOUNTER — Emergency Department (HOSPITAL_COMMUNITY)
Admission: EM | Admit: 2020-07-05 | Discharge: 2020-07-05 | Disposition: A | Discharge status: Home / Self Care | Payer: 59 | Attending: Emergency Medicine | Admitting: Emergency Medicine

## 2020-07-05 DIAGNOSIS — Z5321 Procedure and treatment not carried out due to patient leaving prior to being seen by health care provider: Secondary | ICD-10-CM | POA: Insufficient documentation

## 2020-07-05 DIAGNOSIS — R519 Headache, unspecified: Secondary | ICD-10-CM | POA: Diagnosis present

## 2020-07-05 DIAGNOSIS — I10 Essential (primary) hypertension: Secondary | ICD-10-CM | POA: Insufficient documentation

## 2020-07-05 HISTORY — DX: Essential (primary) hypertension: I10

## 2020-07-05 NOTE — ED Notes (Signed)
Called for vitals no answer °

## 2020-07-05 NOTE — ED Triage Notes (Signed)
Pt arrived by [facility]. Reported HTN pta, recently diagnosed 1 week ago. Had blood shot eye and headache this am and checked bp, it was elevated sbp > 200. Pt took her home meds and bp decreased pta. No distress noted on arrival.

## 2020-07-08 ENCOUNTER — Telehealth: Payer: Self-pay | Admitting: Physician Assistant

## 2020-07-08 ENCOUNTER — Other Ambulatory Visit: Payer: Self-pay

## 2020-07-08 MED ORDER — LOSARTAN POTASSIUM 25 MG PO TABS: 25.0000 mg | ORAL_TABLET | Freq: Every day | ORAL | 0 refills | Status: DC

## 2020-07-08 NOTE — Telephone Encounter (Signed)
Patient would like her Losartain 25 mg. Potassium refilled and sent to [facility] on [address], [place] - Patient will not be out of medication until Tuesday.  She would like a 90 day supply sent in if at all possible.

## 2020-07-08 NOTE — Telephone Encounter (Signed)
Medication sent to patient's preferred pharmacy

## 2020-07-22 ENCOUNTER — Ambulatory Visit (INDEPENDENT_AMBULATORY_CARE_PROVIDER_SITE_OTHER): Payer: 59 | Admitting: Internal Medicine

## 2020-07-22 ENCOUNTER — Encounter: Payer: Self-pay | Admitting: Internal Medicine

## 2020-07-22 VITALS — BP 134/80 | HR 88 | Ht 63.5 in | Wt 196.4 lb

## 2020-07-22 DIAGNOSIS — E6609 Other obesity due to excess calories: Secondary | ICD-10-CM

## 2020-07-22 DIAGNOSIS — Z6834 Body mass index (BMI) 34.0-34.9, adult: Secondary | ICD-10-CM

## 2020-07-22 DIAGNOSIS — D134 Benign neoplasm of liver: Secondary | ICD-10-CM | POA: Diagnosis not present

## 2020-07-22 DIAGNOSIS — K760 Fatty (change of) liver, not elsewhere classified: Secondary | ICD-10-CM | POA: Diagnosis not present

## 2020-07-22 NOTE — Patient Instructions (Addendum)
Please continue to work on decreasing your sugar intake.  Therapist, [email or web address] for help with weight loss tips  Please go to [facility] where you had your MRI done June 2020 and pick up the disc of that MRI. Take the disc with you to the MRI in November.  You have been scheduled for an MRI at [facility] on 08/24/2020. Your appointment time is 9:00AM. Please arrive 30 minutes prior to your appointment time for registration purposes. Please make certain not to have anything to eat or drink 6 hours prior to your test. In addition, if you have any metal in your body, have a pacemaker or defibrillator, please be sure to let your ordering physician know. This test typically takes 45 minutes to 1 hour to complete. Should you need to reschedule, please call [phone] to do so.   I appreciate the opportunity to care for you. Ronney Lion, [facility]

## 2020-07-22 NOTE — Progress Notes (Signed)
Anna [name] 49 y.o. 18-Jun-1971 [id number]  Assessment & Plan:   Encounter Diagnoses  Name Primary?  . Hepatic adenoma Yes  . NAFLD (nonalcoholic fatty liver disease)   . Class 1 obesity due to excess calories with serious comorbidity and body mass index (BMI) of 34.0 to 34.9 in adult       Orders Placed This Encounter  Procedures  . MR Abdomen W Wo Contrast    Need to reassess size of hepatic adenomas  We had a discussion about fatty liver disease and how important it is to lose weight she has evidence of metabolic syndrome is having hypertension issues etc.  She is on the right track by trying to reduce sugars and carbohydrates.  I would encourage her to continue to do that her cardiologist recommended a Mediterranean diet which is excellent and I also believe in the use of restricted feeding and intermittent fasting to lower insulin levels and to reduce abdominal obesity.  Further plans pending the MRI.  I appreciate the opportunity to care for this patient. CC: Brunetta Jeans, PA-C Arvella Nigh, MD Subjective:   Chief Complaint: Fatty liver and hepatic adenoma follow-up  HPI The patient is a 49 year old white woman known to me from prior evaluation of liver lesions thought to be hepatic adenomas and fatty liver as well as iron deficiency anemia (negative EGD and colonoscopy for cause-thought to be menorrhagia).  I had referred her to [facility] surgery for evaluation of liver lesions thought to be hepatic adenomas.  She saw them in 2020.  She stopped birth control pills in January 2020.  10/04/2018 MRI in [facility] system IMPRESSION: 1. Similar number and size of numerous hepatic lesions scattered throughout the liver, largest of which is in segment 6. These lesions have imaging characteristics diagnostic of hepatic adenomas. 2. Cholelithiasis without evidence of acute cholecystitis at this time. 3. Hepatic steatosis.   Electronically Signed   By: Vinnie Langton M.D.   On: 10/12/2018 15:22  04/28/2019 surgery visit at [facility], Dr. Carlis Abbott Assessment/Plan Anna [name] is a 49 y.o. female who presents for follow-up regarding multiple liver lesions most concerning for hepatic adenomas. One of these lesions was over 5 cm and there was concern that resection was required. The patient's most recent imaging demonstrates significant reduction in size of these lesions with the largest lesion measuring 3.2 cm. Given improvement in size, I would not recommend proceeding with surgical resection. The patient would benefit from continued observation. I have recommended updated imaging in 12 months. Glad to see her back at that time to discuss the results.  04/08/2019 MRI [facility] system IMPRESSION:  1. Previously identified liver lesions are mildly reduced in size  compared to the prior exam. I concur with prior interpretations at  these probably represent hepatic adenomas based on the imaging  characteristics.  2. 2.1 cm gallstone in the gallbladder.  3. Diffuse hepatic steatosis.    She has remained off birth control.  She has problems with chest pain in July and hypertensive urgency in September and has been to the emergency department and is also seeing cardiology in the [place] system.  She realizes she is obese and is trying to reduce carbs and is reduced to 1 sugary soda a day.  She reports that her husband has reduced carbohydrates and sugars and has lost about 25 pounds.  When she was in the emergency department in July CT of the chest showed a 5 mm right  lower lobe pulmonary nodule and she had fatty liver she also had a CT abdomen pelvis without contrast that demonstrated the fatty liver as well.  Right ovarian cyst also.  Lab Results  Component Value Date   ALT 12 05/18/2020   AST 11 05/18/2020   ALKPHOS 88 05/18/2020   BILITOT 0.4 05/18/2020   Lab Results  Component Value Date   WBC 11.2 (H) 05/18/2020   HGB 11.8 (L) 05/18/2020    HCT 36.4 05/18/2020   MCV 78.2 05/18/2020   PLT 356.0 05/18/2020     Allergies  Allergen Reactions  . Erythromycin Rash  . Nitrofurantoin Rash   Current Meds  Medication Sig  . carvedilol (COREG) 6.25 MG tablet Take by mouth.  . cholecalciferol (VITAMIN D3) 25 MCG (1000 UNIT) tablet Take 1,000 Units by mouth daily.  Marland Kitchen ELDERBERRY PO Take by mouth.  . ferrous sulfate 325 (65 FE) MG tablet Take 1 tablet (325 mg total) by mouth 2 (two) times daily with a meal.  . losartan (COZAAR) 25 MG tablet Take 1 tablet (25 mg total) by mouth daily.   Past Medical History:  Diagnosis Date  . Allergy   . Cancer ([place])   . Endometriosis    Diagnosed at age 59. Followed by Gynecology -- Dr. Radene Knee  . Gallstones   . GERD (gastroesophageal reflux disease)   . Heart murmur   . Hypertension   . Iron deficiency anemia    on OTC supplement   Past Surgical History:  Procedure Laterality Date  . ABLATION ON ENDOMETRIOSIS  1997  . COLONOSCOPY  03/2018  . deviated setum  1986  . ESOPHAGOGASTRODUODENOSCOPY  03/2018  . MYOMECTOMY     Social History   Social History Narrative   Married   No alcohol tobacco or drug use   family history includes Asthma in her daughter; Diabetes in her mother; Emphysema in her maternal grandmother; Food Allergy in her daughter; Heart attack (age of onset: 71) in her father; Hepatitis C in her maternal uncle; Hypertension in her father and mother; Lung disease in her father and paternal uncle.   Review of Systems As above  Objective:   Physical Exam BP 134/80   Pulse 88   Ht 5' 3.5" (1.613 m)   Wt 196 lb 6 oz (89.1 kg)   BMI 34.24 kg/m

## 2020-07-23 ENCOUNTER — Telehealth: Payer: Self-pay | Admitting: Physician Assistant

## 2020-07-23 DIAGNOSIS — N83201 Unspecified ovarian cyst, right side: Secondary | ICD-10-CM

## 2020-07-23 NOTE — Telephone Encounter (Signed)
Order placed and scheduled.

## 2020-07-23 NOTE — Telephone Encounter (Signed)
Patient due for her repeat pelvic US for R ovarian cyst. I have placed order. She will be contacted to schedule

## 2020-07-23 NOTE — Telephone Encounter (Signed)
-----   Message from Brunetta Jeans, PA-C sent at 05/18/2020  3:34 PM EDT ----- Repeat pelvis US 2 months -- R ovarian cyst.

## 2020-08-05 DIAGNOSIS — E1165 Type 2 diabetes mellitus with hyperglycemia: Secondary | ICD-10-CM | POA: Insufficient documentation

## 2020-08-05 LAB — HEMOGLOBIN A1C: Hemoglobin A1C: 6.3

## 2020-08-05 LAB — LIPID PANEL
Cholesterol: 159 (ref 0–200)
HDL: 69 (ref 35–70)
LDL Cholesterol: 65
Triglycerides: 147 (ref 40–160)

## 2020-08-05 LAB — TSH: TSH: 2.46 (ref <=5.90)

## 2020-08-05 LAB — VITAMIN D 25 HYDROXY (VIT D DEFICIENCY, FRACTURES): Vit D, 25-Hydroxy: 37.3

## 2020-08-24 ENCOUNTER — Other Ambulatory Visit: Payer: Self-pay

## 2020-08-24 ENCOUNTER — Ambulatory Visit (HOSPITAL_COMMUNITY)
Admission: RE | Admit: 2020-08-24 | Discharge: 2020-08-24 | Disposition: A | Discharge status: Home / Self Care | Payer: 59 | Source: Ambulatory Visit | Attending: Internal Medicine | Admitting: Internal Medicine

## 2020-08-24 DIAGNOSIS — D134 Benign neoplasm of liver: Secondary | ICD-10-CM | POA: Diagnosis not present

## 2020-08-24 LAB — POCT I-STAT CREATININE: Creatinine, Ser: 0.5 mg/dL (ref 0.44–1.00)

## 2020-08-24 MED ORDER — GADOXETATE DISODIUM 0.25 MMOL/ML IV SOLN
9.0000 mL | Freq: Once | INTRAVENOUS | Status: AC | PRN
  Administered 2020-08-24: 9 mL via INTRAVENOUS

## 2020-08-27 ENCOUNTER — Telehealth: Payer: Self-pay

## 2020-08-27 ENCOUNTER — Telehealth: Payer: Self-pay | Admitting: Internal Medicine

## 2020-08-27 NOTE — Telephone Encounter (Signed)
Called patient back and left voice message to please call back again

## 2020-08-27 NOTE — Telephone Encounter (Signed)
Faxed referral to  [facility] - Dr. Michaelle Birks, for hepatic adenoma > 5 cm. Have played phone tag with patient, will continue to try to reach her.

## 2020-08-27 NOTE — Telephone Encounter (Signed)
Patient is returning your call.  

## 2020-08-27 NOTE — Telephone Encounter (Signed)
See result note.  

## 2020-08-27 NOTE — Telephone Encounter (Signed)
-----   Message from Gatha Mayer, MD sent at 08/26/2020  4:55 PM EST ----- Hepatic adenomas are a little smaller but the largest is still more than 5 cm in size   Adenomas > that 5 cm are often removed surgically though hers is only slightly bigger than that  I would like her to see a liver surgeon again - we now have one in [place] and is on her insurance - Dr. Michaelle Birks at [place] - please refer   Regardless I think best that she be followed by a liver surgeon for this even if no surgery now

## 2020-08-27 NOTE — Telephone Encounter (Signed)
Patient calling for MRI results.

## 2020-09-06 ENCOUNTER — Telehealth: Payer: Self-pay | Admitting: Physician Assistant

## 2020-09-06 ENCOUNTER — Other Ambulatory Visit: Payer: Self-pay

## 2020-09-06 NOTE — Telephone Encounter (Signed)
LM asking pt to call back to schedule a lab appt

## 2020-09-13 ENCOUNTER — Other Ambulatory Visit: Payer: Self-pay | Admitting: Obstetrics and Gynecology

## 2020-09-13 DIAGNOSIS — Z1231 Encounter for screening mammogram for malignant neoplasm of breast: Secondary | ICD-10-CM

## 2020-09-14 ENCOUNTER — Encounter: Payer: Self-pay | Admitting: Emergency Medicine

## 2020-09-15 ENCOUNTER — Ambulatory Visit: Payer: Self-pay | Admitting: Surgery

## 2020-09-15 NOTE — H&P (Signed)
History of Present Illness (Anna [name] L. Anna [name]; 09/15/2020 3:42 PM) The patient is a 49 year old female who presents with a liver mass.HPI: Anna [name] is a 49 yo female who was referred for evaluation of hepatic adenomas. She has known about the tumors for 3-4 years, and was previously followed by Dr. Carlis Abbott at [facility] for surveillance, however she recently had a change in her insurance and is no longer able to see him. She was first noted to have masses consistent with adenomas a few years ago, at which time she was on oral contraceptives (she reports she had been taking OCPs for most of her adult life). She stopped taking the OCP 22 months ago per Dr. Ainsley Spinner recommendations. She has continued to follow with Dr. Carlean Purl at [place], and recently had a liver MRI on 11/9, which showed a persistent 5.2cm mass in the right liver, relatively stable from prior imaging, as well as multiple smaller lesions consistent with adenoma all <2cm in diameter. She was referred to me to discuss resection. She says she occasionally has discomfort in her upper abdomen and bloating, but no significant RUQ pain. Of note she also has a 2cm gallstone.  Patient has also been diagnosed with NAFLD and counseled on weight loss. Her last LFTs on 05/18/20 were normal.  PMH: NAFLD, HTN, GERD, endometriosis  PSH: laparoscopy for endometriosis, lap resection of uterine fibroids  FHx: uncle had [place] and underwent a liver transplant 5-6 years ago  Social: Social EtOH use, nonsmoker    Allergies (Chanel Nolan, [place]; 09/15/2020 2:54 PM) Macrobid *URINARY ANTI-INFECTIVES*  Rash. Fever Azithromycin *CHEMICALS*  Rash. Fever Allergies Reconciled   Medication History (Chanel Teressa Senter, CMA; 09/15/2020 2:58 PM) Carvedilol (6.25MG  Tablet, Oral) Active. No Current Medications (Taken starting 09/15/2020) amLODIPine Besylate (2.5MG  Tablet, Oral) Active. Potassium Chloride (25MEQ Tablet Effer, Oral) Active. Iron (65MG  Tablet, Oral)  Active. Vitamin K2-Vitamin D3 (Oral) Specific strength unknown - Active. Elderberry (Oral) Specific strength unknown - Active. Vitamin C (Oral) Specific strength unknown - Active. NAC (Oral) Specific strength unknown - Active. Medications Reconciled    Review of Systems (Claxton L. Anna [name]; 09/15/2020 3:34 PM) General Not Present- Appetite Loss and Chills. Skin Not Present- Jaundice. Respiratory Not Present- Cough and Wheezing. Cardiovascular Not Present- Chest Pain. Gastrointestinal Present- Bloating. Not Present- Constipation and Nausea. Musculoskeletal Not Present- Claudication and Muscle Pain. Neurological Not Present- Dizziness and Seizures. Endocrine Not Present- Appetite Changes and Cold Intolerance. Hematology Not Present- Abnormal Bleeding, Blood Clots and Blood Thinners.  Vitals Geni Bers Haggett RMA; 09/15/2020 3:01 PM) 09/15/2020 2:58 PM Weight: 200 lb Height: 63.5in Body Surface Area: 1.94 m Body Mass Index: 34.87 kg/m  Temp.: 97.42F (Temporal)  Pulse: 114 (Regular)  BP: 160/82(Sitting, Left Arm, Standard)       Physical Exam (Meleena Munroe L. Anna [name]; 09/15/2020 3:34 PM) The physical exam findings are as follows: Note: Constitutional: No acute distress; conversant; no deformities Neuro: alert and oriented; cranial nerves grossly in tact; no focal deficits Eyes: Moist conjunctiva; anicteric sclerae; extraocular movements in tact Neck: Trachea midline Lungs: Normal respiratory effort; lungs clear to auscultation bilaterally; symmetric chest wall expansion CV: Regular rate and rhythm; no pitting edema GI: Abdomen soft, nontender, nondistended; no masses or organomegaly MSK: Normal gait and station; no clubbing/cyanosis Psychiatric: Appropriate affect; alert and oriented 3    Assessment & Plan (Daisean Brodhead L. Anna [name]; 09/15/2020 3:43 PM) HEPATIC ADENOMA (D13.4) Impression: 49 yo female referred with hepatic adenomatosis. I reviewed her MRIs from last  month  and 2019, as well as her outside MRI from [facility] done in June 2020 (available in PACS), as well as her records from Drs. Carlean Purl and Carlis Abbott. There is a 5.2cm lesion in segment 6 that has been present on prior imaging and is stable in size, and appears consistent with an adenoma on recent MRI with eovist. This tumor has persisted at >5cm in size after almost 2 years off oral contraceptives, so I recommend resection of this lesion due to risk of bleeding and malignant transformation. There are multiple other adenomas scattered throughout the left liver that are <2cm in size and have regressed since the patient discontinued OCPs. The segment 6 tumor looks amenable to laparoscopic resection. Will plan for cholecystectomy at the same time as patient has a large gallstone. I discussed the management of hepatic adenoma with the patient, as well as the details of laparoscopic partial right hepatectomy and cholecystectomy and possible need for conversion to a laparotomy if significant bleeding occurs or the mass cannot be safely resected laparoscopically. She expressed understanding and agrees to proceed with surgery. Understands that an open operation would require a 4-5 day inpatient stay. Will schedule her for a laparoscopic segment 6 resection, cholecystectomy and intraoperative ultrasound. Patient would like to wait until after the new year to schedule. Postoperatively she will need continued surveillance of the remaining adenomas.  Michaelle Birks, [facility], Hepatobiliary and Pancreatic Surgery 09/15/20 3:44 PM

## 2020-09-16 ENCOUNTER — Ambulatory Visit: Payer: 59

## 2020-09-19 ENCOUNTER — Other Ambulatory Visit: Payer: Self-pay | Admitting: Physician Assistant

## 2020-09-20 NOTE — Telephone Encounter (Signed)
Patient has appointment tomorrow. Will refill then

## 2020-09-21 ENCOUNTER — Ambulatory Visit (INDEPENDENT_AMBULATORY_CARE_PROVIDER_SITE_OTHER): Payer: 59 | Admitting: Physician Assistant

## 2020-09-21 ENCOUNTER — Other Ambulatory Visit: Payer: Self-pay

## 2020-09-21 ENCOUNTER — Encounter: Payer: Self-pay | Admitting: Physician Assistant

## 2020-09-21 ENCOUNTER — Telehealth: Payer: Self-pay | Admitting: Physician Assistant

## 2020-09-21 VITALS — BP 128/70 | HR 91 | Temp 97.5°F | Resp 16 | Ht 63.5 in | Wt 199.0 lb

## 2020-09-21 DIAGNOSIS — I10 Essential (primary) hypertension: Secondary | ICD-10-CM | POA: Diagnosis not present

## 2020-09-21 DIAGNOSIS — M542 Cervicalgia: Secondary | ICD-10-CM | POA: Diagnosis not present

## 2020-09-21 MED ORDER — CYCLOBENZAPRINE HCL 10 MG PO TABS: 10.0000 mg | ORAL_TABLET | Freq: Every day | ORAL | 0 refills | Status: DC

## 2020-09-21 NOTE — Patient Instructions (Signed)
Please continue chronic medications as directed.  Take the Flexeril at night for a couple of nights to help with muscle tension.  Ok to continue heating pad for 10-15 minute increments.  Let me know if symptoms are not continuing to resolve.

## 2020-09-21 NOTE — Telephone Encounter (Signed)
Faxed today visit notes to [facility].

## 2020-09-21 NOTE — Telephone Encounter (Signed)
[facility] would like you to send them office notes on Ms. Blanda's visit today

## 2020-09-21 NOTE — Progress Notes (Signed)
Patient presents to clinic today c/o left-sided neck pain starting at the end of last week after she slept on a new pillow.  Noted pain and stiffness of neck, worse with attempted range of motion.  Denies any noted trauma or injury.  Has been applying icy hot and heating pad to the area with some improvement.  Notes over the past 24 hours symptoms have improved dramatically.  Denies any headache or vision changes.  Has not taken anything for symptoms yet this morning.  Patient also due for hypertension follow-up.  Patient is currently on a regimen of losartan 25 mg daily, carvedilol 6.25 mg twice daily and amlodipine 2.5 mg daily.  Endorses taking medications as directed and tolerating well. Patient denies chest pain, palpitations, lightheadedness, dizziness, vision changes or frequent headaches.  BP Readings from Last 3 Encounters:  09/21/20 128/70  07/22/20 134/80  07/05/20 (!) 162/79     Past Medical History:  Diagnosis Date  . Allergy   . Cancer ([place])   . Endometriosis    Diagnosed at age 80. Followed by Gynecology -- Dr. Radene Knee  . Gallstones   . GERD (gastroesophageal reflux disease)   . Heart murmur   . Hypertension   . Iron deficiency anemia    on OTC supplement    Current Outpatient Medications on File Prior to Visit  Medication Sig Dispense Refill  . amLODipine (NORVASC) 2.5 MG tablet Take 2.5 mg by mouth daily.    . carvedilol (COREG) 6.25 MG tablet Take by mouth.    . cholecalciferol (VITAMIN D3) 25 MCG (1000 UNIT) tablet Take 1,000 Units by mouth daily.    Marland Kitchen ELDERBERRY PO Take by mouth.    . ferrous sulfate 325 (65 FE) MG tablet Take 1 tablet (325 mg total) by mouth 2 (two) times daily with a meal. 180 tablet 3  . losartan (COZAAR) 25 MG tablet Take 1 tablet (25 mg total) by mouth daily. 90 tablet 0   No current facility-administered medications on file prior to visit.    Allergies  Allergen Reactions  . Erythromycin Rash  . Nitrofurantoin Rash    Family  History  Problem Relation Age of Onset  . Hypertension Mother   . Diabetes Mother   . Heart attack Father 20  . Lung disease Father   . Hypertension Father   . Hepatitis C Maternal Uncle   . Lung disease Paternal Uncle   . Emphysema Maternal Grandmother   . Food Allergy Daughter   . Asthma Daughter   . Colon cancer Neg Hx   . Esophageal cancer Neg Hx   . Liver cancer Neg Hx   . Pancreatic cancer Neg Hx   . Stomach cancer Neg Hx   . Rectal cancer Neg Hx     Social History   Socioeconomic History  . Marital status: Married    Spouse name: Not on file  . Number of children: Not on file  . Years of education: Not on file  . Highest education level: Not on file  Occupational History  . Not on file  Tobacco Use  . Smoking status: Never Smoker  . Smokeless tobacco: Never Used  Vaping Use  . Vaping Use: Never used  Substance and Sexual Activity  . Alcohol use: No  . Drug use: No  . Sexual activity: Yes  Other Topics Concern  . Not on file  Social History Narrative   Married   No alcohol tobacco or drug use   Social Determinants  of Health   Financial Resource Strain:   . Difficulty of Paying Living Expenses: Not on file  Food Insecurity:   . Worried About Charity fundraiser in the Last Year: Not on file  . Ran Out of Food in the Last Year: Not on file  Transportation Needs:   . Lack of Transportation (Medical): Not on file  . Lack of Transportation (Non-Medical): Not on file  Physical Activity:   . Days of Exercise per Week: Not on file  . Minutes of Exercise per Session: Not on file  Stress:   . Feeling of Stress : Not on file  Social Connections:   . Frequency of Communication with Friends and Family: Not on file  . Frequency of Social Gatherings with Friends and Family: Not on file  . Attends Religious Services: Not on file  . Active Member of Clubs or Organizations: Not on file  . Attends Archivist Meetings: Not on file  . Marital Status: Not  on file   Review of Systems - See HPI.  All other ROS are negative.  Wt 199 lb (90.3 kg)   BMI 34.70 kg/m   Physical Exam Vitals reviewed.  Constitutional:      Appearance: Normal appearance.  HENT:     Head: Normocephalic and atraumatic.     Right Ear: Tympanic membrane normal.     Left Ear: Tympanic membrane normal.  Eyes:     Conjunctiva/sclera: Conjunctivae normal.     Pupils: Pupils are equal, round, and reactive to light.  Neck:     Thyroid: No thyroid mass.  Cardiovascular:     Rate and Rhythm: Normal rate and regular rhythm.     Pulses: Normal pulses.     Heart sounds: Normal heart sounds.  Pulmonary:     Effort: Pulmonary effort is normal.     Breath sounds: Normal breath sounds.  Musculoskeletal:     Cervical back: Neck supple. No edema, rigidity or torticollis. Muscular tenderness (With mild spasm noted of left trapezius) present. No spinous process tenderness. Normal range of motion.  Lymphadenopathy:     Cervical: No cervical adenopathy.  Neurological:     General: No focal deficit present.     Mental Status: She is alert and oriented to person, place, and time.     Recent Results (from the past 2160 hour(s))  VITAMIN D 25 Hydroxy (Vit-D Deficiency, Fractures)     Status: None   Collection Time: 08/05/20 12:00 AM  Result Value Ref Range   Vit D, 25-Hydroxy 37.3   Lipid panel     Status: None   Collection Time: 08/05/20 12:00 AM  Result Value Ref Range   Triglycerides 147 40 - 160   Cholesterol 159 0 - 200   HDL 69 35 - 70   LDL Cholesterol 65   Hemoglobin A1c     Status: None   Collection Time: 08/05/20 12:00 AM  Result Value Ref Range   Hemoglobin A1C 6.3   TSH     Status: None   Collection Time: 08/05/20 12:00 AM  Result Value Ref Range   TSH 2.46 0.41 - 5.90  I-STAT creatinine     Status: None   Collection Time: 08/24/20  9:21 AM  Result Value Ref Range   Creatinine, Ser 0.50 0.44 - 1.00 mg/dL    Assessment/Plan: 1. Neck pain Mild  muscle tension and strain with spasm.  Significantly improved from previous days.  No alarm signs or symptoms present.  We will have her continue use of heating pad.  OTC medications reviewed.  Rx Flexeril to use nightly for the next couple nights to help relax remaining tension.  Follow-up if not continuing to resolve.  2. Hypertension, unspecified type BP normotensive.  Asymptomatic.  Continue current medication regimen.  This visit occurred during the SARS-CoV-2 public health emergency.  Safety protocols were in place, including screening questions prior to the visit, additional usage of staff PPE, and extensive cleaning of exam room while observing appropriate contact time as indicated for disinfecting solutions.     Leeanne Rio, PA-C

## 2020-10-26 ENCOUNTER — Ambulatory Visit
Admission: RE | Admit: 2020-10-26 | Discharge: 2020-10-26 | Disposition: A | Discharge status: Home / Self Care | Payer: 59 | Source: Ambulatory Visit | Attending: Obstetrics and Gynecology | Admitting: Obstetrics and Gynecology

## 2020-10-26 ENCOUNTER — Other Ambulatory Visit: Payer: Self-pay

## 2020-10-26 DIAGNOSIS — Z1231 Encounter for screening mammogram for malignant neoplasm of breast: Secondary | ICD-10-CM

## 2020-11-08 ENCOUNTER — Inpatient Hospital Stay (HOSPITAL_COMMUNITY): Admission: RE | Admit: 2020-11-08 | Payer: 59 | Source: Ambulatory Visit

## 2020-11-22 DIAGNOSIS — Z01818 Encounter for other preprocedural examination: Secondary | ICD-10-CM | POA: Insufficient documentation

## 2020-12-13 ENCOUNTER — Telehealth: Payer: Self-pay | Admitting: Internal Medicine

## 2020-12-13 NOTE — Telephone Encounter (Signed)
Pt is requesting a call back from a nurse to discuss her gallbladder removal, pt would like to review other options.

## 2020-12-13 NOTE — Telephone Encounter (Signed)
Left message for patient to call back Patient was referred to [facility] for hepatic adenoma.  Patient scheduled for partial right hepatectomy not cholecystectomy.

## 2020-12-14 NOTE — Telephone Encounter (Signed)
Patient reports that they plan to perform cholecystectomy with the hepatectomy.  She is asking if this is absolutely necessary.  She is advised to call [facility] and ask if the cholecystectomy is being performed out of convenience because of her gallstones or is it necessary in conjunction with the hepatectomy.  She verbalized understanding.  She wants to keep gallbladder if she has the option.  She thanked me for the call and information.

## 2020-12-16 ENCOUNTER — Encounter: Payer: Self-pay | Admitting: Physician Assistant

## 2020-12-21 ENCOUNTER — Telehealth: Payer: Self-pay | Admitting: Internal Medicine

## 2020-12-21 NOTE — Telephone Encounter (Signed)
Patient is wanting a second opinion and would like to be referred to [facility]. She is not sure who accepts her insurance.  She is encouraged to contact her insurance company and inquire about surgical groups that are in her networkl  The surgeon that she would like to see is not covered and he is a [facility] MD.  She will call back

## 2020-12-22 ENCOUNTER — Encounter (HOSPITAL_COMMUNITY): Payer: Self-pay | Admitting: Anesthesiology

## 2021-01-04 ENCOUNTER — Ambulatory Visit: Payer: Self-pay | Admitting: Surgery

## 2021-01-04 NOTE — Progress Notes (Signed)
Anna [name] has informed PAT scheduler 2 times that she is cancelling surgery., patient said that she had called the office . I called [facility], a note will be sent to the scheduler supervisor, to remove the surgery from the schedule.

## 2021-01-05 ENCOUNTER — Inpatient Hospital Stay (HOSPITAL_COMMUNITY): Admission: RE | Admit: 2021-01-05 | Payer: 59 | Source: Home / Self Care | Admitting: Surgery

## 2021-01-05 SURGERY — HEPATECTOMY, PARTIAL, LAPAROSCOPIC
Anesthesia: General

## 2021-01-27 ENCOUNTER — Encounter: Payer: Self-pay | Admitting: Physician Assistant

## 2021-01-27 ENCOUNTER — Other Ambulatory Visit: Payer: Self-pay

## 2021-01-27 ENCOUNTER — Ambulatory Visit (INDEPENDENT_AMBULATORY_CARE_PROVIDER_SITE_OTHER): Payer: 59 | Admitting: Physician Assistant

## 2021-01-27 VITALS — BP 137/83 | HR 89 | Temp 98.2°F | Ht 63.5 in | Wt 202.2 lb

## 2021-01-27 DIAGNOSIS — R310 Gross hematuria: Secondary | ICD-10-CM | POA: Diagnosis not present

## 2021-01-27 DIAGNOSIS — R309 Painful micturition, unspecified: Secondary | ICD-10-CM | POA: Diagnosis not present

## 2021-01-27 DIAGNOSIS — D134 Benign neoplasm of liver: Secondary | ICD-10-CM | POA: Diagnosis not present

## 2021-01-27 LAB — POC URINALSYSI DIPSTICK (AUTOMATED)
Bilirubin, UA: NEGATIVE
Glucose, UA: NEGATIVE
Ketones, UA: NEGATIVE
Leukocytes, UA: NEGATIVE
Nitrite, UA: NEGATIVE
Protein, UA: NEGATIVE
Spec Grav, UA: 1.025 (ref 1.010–1.025)
Urobilinogen, UA: 0.2 E.U./dL
pH, UA: 6 (ref 5.0–8.0)

## 2021-01-27 MED ORDER — CEPHALEXIN 500 MG PO CAPS: 500.0000 mg | ORAL_CAPSULE | Freq: Three times a day (TID) | ORAL | 0 refills | Status: AC

## 2021-01-27 NOTE — Progress Notes (Signed)
Established Patient Office Visit  Subjective:  Patient ID: Anna [name], female    DOB: 06-12-1971  Age: 50 y.o. MRN: [id number]  CC:  Chief Complaint  Patient presents with  . Dysuria    HPI Anna [name] presents for transfer of care visit from [facility] [place].  Acute concerns today: 1. She has been having some dysuria and noticed blood present for the last few days. She thinks she might have a UTI. Denies any fever or chills. No abdominal pain. No flank pain.  2. Hx of hepatic adenomas. Pt states she was scheduled for surgery on 01/05/21, but she had to cancel. She is hoping to get a new referral for a second opinion.   Past Medical History:  Diagnosis Date  . Allergy   . Cancer ([place])   . Endometriosis    Diagnosed at age 42. Followed by Gynecology -- Dr. Radene Knee  . Gallstones   . GERD (gastroesophageal reflux disease)   . Heart murmur   . Hypertension   . Iron deficiency anemia    on OTC supplement    Past Surgical History:  Procedure Laterality Date  . ABLATION ON ENDOMETRIOSIS  1997  . COLONOSCOPY  03/2018  . deviated setum  1986  . ESOPHAGOGASTRODUODENOSCOPY  03/2018  . MYOMECTOMY      Family History  Problem Relation Age of Onset  . Hypertension Mother   . Diabetes Mother   . Heart attack Father 35  . Lung disease Father   . Hypertension Father   . Hepatitis C Maternal Uncle   . Lung disease Paternal Uncle   . Emphysema Maternal Grandmother   . Food Allergy Daughter   . Asthma Daughter   . Colon cancer Neg Hx   . Esophageal cancer Neg Hx   . Liver cancer Neg Hx   . Pancreatic cancer Neg Hx   . Stomach cancer Neg Hx   . Rectal cancer Neg Hx     Social History   Socioeconomic History  . Marital status: Married    Spouse name: Not on file  . Number of children: Not on file  . Years of education: Not on file  . Highest education level: Not on file  Occupational History  . Not on file  Tobacco Use  . Smoking status: Never Smoker  .  Smokeless tobacco: Never Used  Vaping Use  . Vaping Use: Never used  Substance and Sexual Activity  . Alcohol use: No  . Drug use: No  . Sexual activity: Yes  Other Topics Concern  . Not on file  Social History Narrative   Married   No alcohol tobacco or drug use   Social Determinants of Radio broadcast assistant Strain: Not on file  Food Insecurity: Not on file  Transportation Needs: Not on file  Physical Activity: Not on file  Stress: Not on file  Social Connections: Not on file  Intimate Partner Violence: Not on file    Outpatient Medications Prior to Visit  Medication Sig Dispense Refill  . amLODipine (NORVASC) 2.5 MG tablet Take 2.5 mg by mouth daily.    . carvedilol (COREG) 6.25 MG tablet Take by mouth.    . cholecalciferol (VITAMIN D3) 25 MCG (1000 UNIT) tablet Take 1,000 Units by mouth daily.    . cyclobenzaprine (FLEXERIL) 10 MG tablet Take 1 tablet (10 mg total) by mouth at bedtime. 10 tablet 0  . ELDERBERRY PO Take by mouth.    . ferrous  sulfate 325 (65 FE) MG tablet Take 1 tablet (325 mg total) by mouth 2 (two) times daily with a meal. 180 tablet 3  . losartan (COZAAR) 25 MG tablet TAKE ONE TABLET BY MOUTH DAILY 90 tablet 1   No facility-administered medications prior to visit.    Allergies  Allergen Reactions  . Erythromycin Rash  . Nitrofurantoin Rash    ROS Review of Systems REFER TO HPI FOR PERTINENT POSITIVES AND NEGATIVES    Objective:    Physical Exam Vitals and nursing note reviewed.  Constitutional:      General: She is not in acute distress.    Appearance: Normal appearance. She is obese.  HENT:     Head: Normocephalic.     Nose: Nose normal.     Mouth/Throat:     Mouth: Mucous membranes are moist.  Eyes:     Extraocular Movements: Extraocular movements intact.     Pupils: Pupils are equal, round, and reactive to light.  Cardiovascular:     Rate and Rhythm: Normal rate and regular rhythm.     Pulses: Normal pulses.     Heart  sounds: Normal heart sounds.  Pulmonary:     Effort: Pulmonary effort is normal.     Breath sounds: Normal breath sounds.  Abdominal:     General: Abdomen is flat. Bowel sounds are normal. There is no distension.     Palpations: Abdomen is soft.     Tenderness: There is no right CVA tenderness or left CVA tenderness.  Neurological:     Mental Status: She is alert.  Psychiatric:        Mood and Affect: Mood normal.        Behavior: Behavior normal.     BP 137/83   Pulse 89   Temp 98.2 F (36.8 C)   Ht 5' 3.5" (1.613 m)   Wt 202 lb 3.2 oz (91.7 kg)   LMP 01/17/2021   SpO2 99%   BMI 35.26 kg/m  Wt Readings from Last 3 Encounters:  01/27/21 202 lb 3.2 oz (91.7 kg)  09/21/20 199 lb (90.3 kg)  07/22/20 196 lb 6 oz (89.1 kg)     Health Maintenance Due  Topic Date Due  . Hepatitis C Screening  Never done  . HIV Screening  Never done  . PAP SMEAR-Modifier  09/20/2019    There are no preventive care reminders to display for this patient.  Lab Results  Component Value Date   TSH 2.46 08/05/2020   Lab Results  Component Value Date   WBC 11.2 (H) 05/18/2020   HGB 11.8 (L) 05/18/2020   HCT 36.4 05/18/2020   MCV 78.2 05/18/2020   PLT 356.0 05/18/2020   Lab Results  Component Value Date   NA 134 (L) 05/18/2020   K 4.3 05/18/2020   CO2 28 05/18/2020   GLUCOSE 95 05/18/2020   BUN 12 05/18/2020   CREATININE 0.50 08/24/2020   BILITOT 0.4 05/18/2020   ALKPHOS 88 05/18/2020   AST 11 05/18/2020   ALT 12 05/18/2020   PROT 7.0 05/18/2020   ALBUMIN 4.0 05/18/2020   CALCIUM 9.7 05/18/2020   GFR 130.32 05/18/2020   Lab Results  Component Value Date   CHOL 159 08/05/2020   Lab Results  Component Value Date   HDL 69 08/05/2020   Lab Results  Component Value Date   LDLCALC 65 08/05/2020   Lab Results  Component Value Date   TRIG 147 08/05/2020   No results found for:  CHOLHDL Lab Results  Component Value Date   HGBA1C 6.3 08/05/2020      Assessment &  Plan:   Problem List Items Addressed This Visit   None   Visit Diagnoses    Painful urination    -  Primary   Relevant Orders   POCT Urinalysis Dipstick (Automated)      No orders of the defined types were placed in this encounter.   Follow-up: No follow-ups on file.   1. Painful urination 2. Gross hematuria Urinalysis    Component Value Date/Time   BILIRUBINUR neg 01/27/2021 1019   PROTEINUR Negative 01/27/2021 1019   UROBILINOGEN 0.2 01/27/2021 1019   NITRITE neg 01/27/2021 1019   LEUKOCYTESUR Negative 01/27/2021 1019   Urinalysis is unremarkable. However, given symptoms and holiday weekend coming up, gave Rx for Cephalexin to start on if she continues to have symptoms. Encouraged her to try AZO and pushing fluids. She will go to urgent care if acutely worse.  3. Hepatic adenoma I personally reviewed her imaging report from 08/24/20 (MRI Abdomen W/w/o Contrast). She has multiple lesions throughout both lobes of the liver. She has a dominant lesion in the inferior right liver measuring 5.2 x 3.9 cm; comparable to her previous image report in 2019. I will refer her to Dr. Adelina Mings general surgeon for second opinion per request of patient.   Isidora Laham M Princesa Willig, PA-C

## 2021-01-27 NOTE — Patient Instructions (Signed)
You may have a bladder infection or may have passed a renal stone. Please continue to push fluids. Try AZO if pain. May start on Cephalexin over Easter weekend if continued infection symptoms. I probably won't have your culture back until Monday or Tuesday next week.  Will try to refer to Dr. Rocco Serene for second opinion on liver adenoma.   See you back in 6 months for CPE.  Call sooner if any concerns.

## 2021-01-28 LAB — URINE CULTURE
MICRO NUMBER:: 11770934
SPECIMEN QUALITY:: ADEQUATE

## 2021-02-01 ENCOUNTER — Telehealth: Payer: Self-pay

## 2021-02-01 NOTE — Telephone Encounter (Signed)
[facility] called to let us know that they do not accept [facility] and that we may want to place referral to elsewhere.

## 2021-02-02 ENCOUNTER — Telehealth: Payer: Self-pay

## 2021-02-02 MED ORDER — LOSARTAN POTASSIUM 25 MG PO TABS: 25.0000 mg | ORAL_TABLET | Freq: Two times a day (BID) | ORAL | 1 refills | Status: DC

## 2021-02-02 NOTE — Telephone Encounter (Signed)
I have contacted the patient and made her aware she will need to call insurance to see if [facility] or another facility is in network since she is requesting these due to overnight guest being able to stay with her-patient has agreed to call and I will call [facility] and a few others to see if they allow overnight guests

## 2021-02-02 NOTE — Telephone Encounter (Signed)
See below

## 2021-02-02 NOTE — Telephone Encounter (Signed)
Called and lm for pt tcb. 

## 2021-02-02 NOTE — Telephone Encounter (Signed)
Patient reported she is taking losartan twice daily but that has not been documented in her chart-I told the patient I can fill 90 supply for the one time daily and send a note to a provider requesting a change to twice daily which she agreed to and would like the Rx sent to [facility] on [address] -please advise if dosage change is appropriate

## 2021-02-02 NOTE — Addendum Note (Signed)
Addended by: Clyde Lundborg A on: 02/02/2021 03:06 PM   Modules accepted: Orders

## 2021-02-02 NOTE — Telephone Encounter (Signed)
Blood pressure has been well controlled. I am ok changing to losartan 25mg  twice daily #180 with 1 refill if that was the dose she was on at last office visit (please confirm) and then keep October visit with [facility]

## 2021-02-02 NOTE — Telephone Encounter (Signed)
PT returned call, medication confirmed and Rx sent to pharmacy.

## 2021-05-31 ENCOUNTER — Encounter: Payer: Self-pay | Admitting: Physician Assistant

## 2021-05-31 ENCOUNTER — Ambulatory Visit: Payer: 59 | Admitting: Physician Assistant

## 2021-05-31 ENCOUNTER — Other Ambulatory Visit: Payer: Self-pay

## 2021-05-31 ENCOUNTER — Ambulatory Visit (INDEPENDENT_AMBULATORY_CARE_PROVIDER_SITE_OTHER)
Admission: RE | Admit: 2021-05-31 | Discharge: 2021-05-31 | Disposition: A | Discharge status: Home / Self Care | Payer: 59 | Source: Ambulatory Visit | Attending: Physician Assistant | Admitting: Physician Assistant

## 2021-05-31 VITALS — BP 146/83 | HR 94 | Temp 98.0°F | Ht 63.5 in | Wt 204.2 lb

## 2021-05-31 DIAGNOSIS — M79672 Pain in left foot: Secondary | ICD-10-CM | POA: Diagnosis not present

## 2021-05-31 DIAGNOSIS — M79671 Pain in right foot: Secondary | ICD-10-CM

## 2021-05-31 MED ORDER — NAPROXEN 500 MG PO TABS: 500.0000 mg | ORAL_TABLET | Freq: Two times a day (BID) | ORAL | 0 refills | Status: AC

## 2021-05-31 NOTE — Progress Notes (Signed)
Acute Office Visit  Subjective:    Patient ID: Anna [name], female    DOB: 30-Aug-1971, 50 y.o.   MRN: [id number]  Chief Complaint  Patient presents with   Foot Swelling    HPI Patient is in today for right foot pain x several months. Also starting to feel it in her left foot as well now. Balls of her feet, especially between 2nd and third toes. Feels like she is walking on a bruise. No treatments tried so far.   Past Medical History:  Diagnosis Date   Allergy    Cancer ([place])    Endometriosis    Diagnosed at age 43. Followed by Gynecology -- Dr. Radene Knee   Gallstones    GERD (gastroesophageal reflux disease)    Heart murmur    Hypertension    Iron deficiency anemia    on OTC supplement    Past Surgical History:  Procedure Laterality Date   ABLATION ON ENDOMETRIOSIS  1997   COLONOSCOPY  03/2018   deviated setum  1986   ESOPHAGOGASTRODUODENOSCOPY  03/2018   MYOMECTOMY      Family History  Problem Relation Age of Onset   Hypertension Mother    Diabetes Mother    Heart attack Father 43   Lung disease Father    Hypertension Father    Hepatitis C Maternal Uncle    Lung disease Paternal Uncle    Emphysema Maternal Grandmother    Food Allergy Daughter    Asthma Daughter    Colon cancer Neg Hx    Esophageal cancer Neg Hx    Liver cancer Neg Hx    Pancreatic cancer Neg Hx    Stomach cancer Neg Hx    Rectal cancer Neg Hx     Social History   Socioeconomic History   Marital status: Married    Spouse name: Not on file   Number of children: Not on file   Years of education: Not on file   Highest education level: Not on file  Occupational History   Not on file  Tobacco Use   Smoking status: Never   Smokeless tobacco: Never  Vaping Use   Vaping Use: Never used  Substance and Sexual Activity   Alcohol use: No   Drug use: No   Sexual activity: Yes  Other Topics Concern   Not on file  Social History Narrative   Married   No alcohol tobacco or drug use    Social Determinants of Radio broadcast assistant Strain: Not on file  Food Insecurity: Not on file  Transportation Needs: Not on file  Physical Activity: Not on file  Stress: Not on file  Social Connections: Not on file  Intimate Partner Violence: Not on file    Outpatient Medications Prior to Visit  Medication Sig Dispense Refill   amLODipine (NORVASC) 2.5 MG tablet Take 2.5 mg by mouth daily.     carvedilol (COREG) 6.25 MG tablet Take by mouth.     cholecalciferol (VITAMIN D3) 25 MCG (1000 UNIT) tablet Take 1,000 Units by mouth daily.     cyclobenzaprine (FLEXERIL) 10 MG tablet Take 1 tablet (10 mg total) by mouth at bedtime. 10 tablet 0   ELDERBERRY PO Take by mouth.     ferrous sulfate 325 (65 FE) MG tablet Take 1 tablet (325 mg total) by mouth 2 (two) times daily with a meal. 180 tablet 3   losartan (COZAAR) 25 MG tablet Take 1 tablet (25 mg total) by mouth  in the morning and at bedtime. 180 tablet 1   No facility-administered medications prior to visit.    Allergies  Allergen Reactions   Erythromycin Rash   Nitrofurantoin Rash    Review of Systems REFER TO HPI FOR PERTINENT POSITIVES AND NEGATIVES     Objective:    Physical Exam Musculoskeletal:       Feet:    BP (!) 146/83   Pulse 94   Temp 98 F (36.7 C)   Ht 5' 3.5" (1.613 m)   Wt 204 lb 3.2 oz (92.6 kg)   SpO2 98%   BMI 35.61 kg/m  Wt Readings from Last 3 Encounters:  05/31/21 204 lb 3.2 oz (92.6 kg)  01/27/21 202 lb 3.2 oz (91.7 kg)  09/21/20 199 lb (90.3 kg)    Health Maintenance Due  Topic Date Due   COVID-19 Vaccine (1) Never done   HIV Screening  Never done   Hepatitis C Screening  Never done   PAP SMEAR-Modifier  09/20/2019   Zoster Vaccines- Shingrix (1 of 2) Never done   INFLUENZA VACCINE  05/16/2021    There are no preventive care reminders to display for this patient.   Lab Results  Component Value Date   TSH 2.46 08/05/2020   Lab Results  Component Value Date   WBC  11.2 (H) 05/18/2020   HGB 11.8 (L) 05/18/2020   HCT 36.4 05/18/2020   MCV 78.2 05/18/2020   PLT 356.0 05/18/2020   Lab Results  Component Value Date   NA 134 (L) 05/18/2020   K 4.3 05/18/2020   CO2 28 05/18/2020   GLUCOSE 95 05/18/2020   BUN 12 05/18/2020   CREATININE 0.50 08/24/2020   BILITOT 0.4 05/18/2020   ALKPHOS 88 05/18/2020   AST 11 05/18/2020   ALT 12 05/18/2020   PROT 7.0 05/18/2020   ALBUMIN 4.0 05/18/2020   CALCIUM 9.7 05/18/2020   GFR 130.32 05/18/2020   Lab Results  Component Value Date   CHOL 159 08/05/2020   Lab Results  Component Value Date   HDL 69 08/05/2020   Lab Results  Component Value Date   LDLCALC 65 08/05/2020   Lab Results  Component Value Date   TRIG 147 08/05/2020   No results found for: CHOLHDL Lab Results  Component Value Date   HGBA1C 6.3 08/05/2020       Assessment & Plan:   Problem List Items Addressed This Visit   None Visit Diagnoses     Bilateral foot pain    -  Primary   Relevant Orders   DG Foot Complete Left   DG Foot Complete Right        Meds ordered this encounter  Medications   naproxen (NAPROSYN) 500 MG tablet    Sig: Take 1 tablet (500 mg total) by mouth 2 (two) times daily with a meal for 10 days.    Dispense:  20 tablet    Refill:  0   1. Bilateral foot pain NKI. May be related to her gait or shoes or both. She may have arthritis vs mortons neuroma. Will schedule for XRAYs of both feet. Trial Naprosyn and ice massages. Podiatry if worse or no improvement.   Feliberto Stockley M Vinay Ertl, PA-C

## 2021-05-31 NOTE — Patient Instructions (Signed)
Please go to [facility] for x-rays of both of your feet. Take the naprosyn as directed. Gentle ice massage to feet daily may help. Consider podiatry referral.

## 2021-06-08 ENCOUNTER — Telehealth: Payer: Self-pay

## 2021-06-08 NOTE — Telephone Encounter (Signed)
.   Encourage patient to contact the pharmacy for refills or they can request refills through [facility]:  Please schedule appointment if longer than 1 year  NEXT APPOINTMENT DATE:  MEDICATION:naproxen (NAPROSYN) 500 MG tablet  Is the patient out of medication?   PHARMACY:[facility] [id number] - [place], [address]     Patient states she would like another round due to her foot still in pain please advise

## 2021-06-09 ENCOUNTER — Other Ambulatory Visit: Payer: Self-pay

## 2021-06-09 DIAGNOSIS — M79671 Pain in right foot: Secondary | ICD-10-CM

## 2021-06-09 NOTE — Telephone Encounter (Signed)
OK to refill? Patient still experiencing foot pain

## 2021-06-09 NOTE — Telephone Encounter (Signed)
LMOVM to return call. Referral to [facility] placed

## 2021-06-10 ENCOUNTER — Other Ambulatory Visit: Payer: Self-pay | Admitting: Family Medicine

## 2021-06-21 ENCOUNTER — Ambulatory Visit (INDEPENDENT_AMBULATORY_CARE_PROVIDER_SITE_OTHER): Payer: 59

## 2021-06-21 ENCOUNTER — Ambulatory Visit (INDEPENDENT_AMBULATORY_CARE_PROVIDER_SITE_OTHER): Payer: 59 | Admitting: Podiatry

## 2021-06-21 ENCOUNTER — Other Ambulatory Visit: Payer: Self-pay

## 2021-06-21 DIAGNOSIS — D3610 Benign neoplasm of peripheral nerves and autonomic nervous system, unspecified: Secondary | ICD-10-CM

## 2021-06-21 DIAGNOSIS — M7741 Metatarsalgia, right foot: Secondary | ICD-10-CM

## 2021-06-21 DIAGNOSIS — M7742 Metatarsalgia, left foot: Secondary | ICD-10-CM

## 2021-06-21 DIAGNOSIS — M778 Other enthesopathies, not elsewhere classified: Secondary | ICD-10-CM

## 2021-06-21 DIAGNOSIS — S9030XA Contusion of unspecified foot, initial encounter: Secondary | ICD-10-CM | POA: Diagnosis not present

## 2021-06-21 MED ORDER — TRIAMCINOLONE ACETONIDE 10 MG/ML IJ SUSP
10.0000 mg | Freq: Once | INTRAMUSCULAR | Status: AC
  Administered 2021-06-21: 10 mg

## 2021-06-21 MED ORDER — MELOXICAM 15 MG PO TABS: 15.0000 mg | ORAL_TABLET | Freq: Every day | ORAL | 0 refills | Status: DC

## 2021-06-21 NOTE — Patient Instructions (Signed)
Morton Neuralgia Morton neuralgia is foot pain that affects the ball of the foot and the area near the toes. Morton neuralgia occurs when part of a nerve in the foot (digital nerve) is under too much pressure (compressed). When this happens over a long period of time, the nerve can thicken (neuroma) and cause pain. Pain usually occurs between the third and fourth toes.  Morton neuralgia can come and go but may get worse over time. What are the causes? This condition is caused by doing the same things over and over with your foot, such as: Activities such as running or jumping. Wearing shoes that are too tight. What increases the risk? You may be at higher risk for Morton neuralgia if you: Are female. Wear high heels. Wear shoes that are narrow or tight. Do activities that repeatedly stretch your toes, such as: Running. Ballet. Long-distance walking. What are the signs or symptoms? The first symptom of Morton neuralgia is pain that spreads from the ball of the foot to the toes. It may feel like you are walking on a marble. Pain usually gets worse with walking and goes away at night. Other symptoms may include numbness and cramping of your toes. Both feet are equally affected, but rarely at the same time. How is this diagnosed? This condition is diagnosed based on your symptoms, your medical history, and a physical exam. Your health care provider may: Squeeze your foot just behind your toe. Ask you to move your toes to check for pain. Ask about your physical activity level. You also may have imaging tests, such as an X-ray, ultrasound, or MRI. How is this treated? Treatment depends on how severe your condition is and what causes it. Treatment may involve: Wearing different shoes that are not too tight, are low-heeled, and provide good support. For some people, this is the only treatment needed. Wearing an over-the-counter or custom supportive pad (orthotic) under the front of your  foot. Getting injections of numbing medicine and anti-inflammatory medicine (steroid) in the nerve. Having surgery to remove part of the thickened nerve. Follow these instructions at home: Managing pain, stiffness, and swelling  Massage your foot as needed. Wear orthotics as told by your health care provider. If directed, put ice on your foot: Put ice in a plastic bag. Place a towel between your skin and the bag. Leave the ice on for 20 minutes, 2-3 times a day. Avoid activities that cause pain or make pain worse. If you play sports, ask your health care provider when it is safe for you to return to sports. Raise (elevate) your foot above the level of your heart while lying down and, when possible, while sitting. General instructions Take over-the-counter and prescription medicines only as told by your health care provider. Do not drive or use heavy machinery while taking prescription pain medicine. Wear shoes that: Have soft soles. Have a wide toe area. Provide arch support. Do not pinch or squeeze your feet. Have room for your orthotics, if applicable. Keep all follow-up visits as told by your health care provider. This is important. Contact a health care provider if: Your symptoms get worse or do not get better with treatment and home care. Summary Morton neuralgia is foot pain that affects the ball of the foot and the area near the toes. Pain usually occurs between the third and fourth toes, gets worse with walking, and goes away at night. Morton neuralgia occurs when part of a nerve in the foot (digital nerve) is  under too much pressure. When this happens over a long period of time, the nerve can thicken (neuroma) and cause pain. This condition is caused by doing the same things over and over with your foot, such as running or jumping, wearing shoes that are too tight, or wearing high heels. Treatment may involve wearing low-heeled shoes that are not too tight, wearing a supportive  pad (orthotic) under the front of your foot, getting injections in the nerve, or having surgery to remove part of the thickened nerve. This information is not intended to replace advice given to you by your health care provider. Make sure you discuss any questions you have with your health care provider. Document Revised: 01/11/2021 Document Reviewed: 10/16/2017 Elsevier Patient Education  2022 [employer].

## 2021-06-22 NOTE — Progress Notes (Signed)
Subjective:   Patient ID: Anna [name], female   DOB: 50 y.o.   MRN: [id number]   HPI 50 year old female presents the office with concerns about foot pain right side worse than left.  She has seen her primary care physician and x-rays were performed which were negative and she was diagnosed with a neuroma on the right side.  She does get some generalized pain in the ball of her foot as well given the right side worse than left.  No numbness or tingling but she states her toes do feel funny at times.  She is tried over-the-counter inserts with slight improvement.   Review of Systems  All other systems reviewed and are negative.  Past Medical History:  Diagnosis Date   Allergy    Cancer ([place])    Endometriosis    Diagnosed at age 59. Followed by Gynecology -- Dr. Radene Knee   Gallstones    GERD (gastroesophageal reflux disease)    Heart murmur    Hypertension    Iron deficiency anemia    on OTC supplement    Past Surgical History:  Procedure Laterality Date   ABLATION ON ENDOMETRIOSIS  1997   COLONOSCOPY  03/2018   deviated setum  1986   ESOPHAGOGASTRODUODENOSCOPY  03/2018   MYOMECTOMY       Current Outpatient Medications:    meloxicam (MOBIC) 15 MG tablet, Take 1 tablet (15 mg total) by mouth daily., Disp: 30 tablet, Rfl: 0   amLODipine (NORVASC) 2.5 MG tablet, Take 2.5 mg by mouth daily., Disp: , Rfl:    carvedilol (COREG) 6.25 MG tablet, Take by mouth., Disp: , Rfl:    cholecalciferol (VITAMIN D3) 25 MCG (1000 UNIT) tablet, Take 1,000 Units by mouth daily., Disp: , Rfl:    cyclobenzaprine (FLEXERIL) 10 MG tablet, Take 1 tablet (10 mg total) by mouth at bedtime., Disp: 10 tablet, Rfl: 0   ELDERBERRY PO, Take by mouth., Disp: , Rfl:    ferrous sulfate 325 (65 FE) MG tablet, Take 1 tablet (325 mg total) by mouth 2 (two) times daily with a meal., Disp: 180 tablet, Rfl: 3   losartan (COZAAR) 25 MG tablet, TAKE 1 TABLET BY MOUTH IN THE MORNING AND AT BEDTIME, Disp: 180 tablet, Rfl:  0  Allergies  Allergen Reactions   Erythromycin Rash   Nitrofurantoin Rash   Red Dye Rash         Objective:  Physical Exam  General: AAO x3, NAD  Dermatological: Skin is warm, dry and supple bilateral. There are no open sores, no preulcerative lesions, no rash or signs of infection present.  Vascular: Dorsalis Pedis artery and Posterior Tibial artery pedal pulses are 2/4 bilateral with immedate capillary fill time. There is no pain with calf compression, swelling, warmth, erythema.   Neruologic: Grossly intact via light touch bilateral.  Negative Tinel sign bilaterally.  Musculoskeletal: Majority tenderness is localized to the second interspaces bilaterally with the right side worse than left.  Small neuroma is palpable.  There is generalized discomfort of the ball the foot bilaterally.  No specific area of pinpoint tenderness.  Muscular strength 5/5 in all groups tested bilateral.  Gait: Unassisted, Nonantalgic.       Assessment:   Neuroma right second interspace; bilateral metatarsalgia     Plan:  -Treatment options discussed including all alternatives, risks, and complications -Etiology of symptoms were discussed -Previous x-rays reviewed.  Notice of acute fracture. -Steroid injection to the second interspace.  Skin was prepped with alcohol and  a mixture of 1 cc Kenalog 10, 1 cc pressure of lidocaine plain/Marcaine plain was infiltrated into the second interspace on the area in her mother any complications.  Postinjection care discussed.  Tolerated well. -Prescribe meloxicam. -She purchased over-the-counter inserts which have been somewhat helpful.  Added metatarsal pad to this.  Consider custom if needed.  Trula Slade DPM  -

## 2021-06-26 ENCOUNTER — Other Ambulatory Visit: Payer: Self-pay | Admitting: Family Medicine

## 2021-07-17 ENCOUNTER — Other Ambulatory Visit: Payer: Self-pay | Admitting: Podiatry

## 2021-07-18 NOTE — Telephone Encounter (Signed)
Please advise 

## 2021-08-02 ENCOUNTER — Encounter: Payer: 59 | Admitting: Physician Assistant

## 2021-08-02 ENCOUNTER — Other Ambulatory Visit: Payer: Self-pay | Admitting: Family Medicine

## 2021-08-07 ENCOUNTER — Other Ambulatory Visit: Payer: Self-pay | Admitting: Family Medicine

## 2021-08-09 ENCOUNTER — Encounter: Payer: 59 | Admitting: Physician Assistant

## 2021-08-10 ENCOUNTER — Other Ambulatory Visit: Payer: Self-pay

## 2021-08-10 ENCOUNTER — Ambulatory Visit (INDEPENDENT_AMBULATORY_CARE_PROVIDER_SITE_OTHER): Payer: 59 | Admitting: Physician Assistant

## 2021-08-10 ENCOUNTER — Encounter: Payer: Self-pay | Admitting: Physician Assistant

## 2021-08-10 VITALS — BP 129/78 | HR 89 | Temp 97.7°F | Ht 63.5 in | Wt 200.2 lb

## 2021-08-10 DIAGNOSIS — Z0000 Encounter for general adult medical examination without abnormal findings: Secondary | ICD-10-CM | POA: Diagnosis not present

## 2021-08-10 DIAGNOSIS — I10 Essential (primary) hypertension: Secondary | ICD-10-CM

## 2021-08-10 DIAGNOSIS — D509 Iron deficiency anemia, unspecified: Secondary | ICD-10-CM

## 2021-08-10 DIAGNOSIS — Z131 Encounter for screening for diabetes mellitus: Secondary | ICD-10-CM

## 2021-08-10 DIAGNOSIS — Z13220 Encounter for screening for lipoid disorders: Secondary | ICD-10-CM | POA: Diagnosis not present

## 2021-08-10 LAB — CBC WITH DIFFERENTIAL/PLATELET
Basophils Absolute: 0.1 10*3/uL (ref 0.0–0.1)
Basophils Relative: 1 % (ref 0.0–3.0)
Eosinophils Absolute: 0.3 10*3/uL (ref 0.0–0.7)
Eosinophils Relative: 2.9 % (ref 0.0–5.0)
HCT: 36.6 % (ref 36.0–46.0)
Hemoglobin: 11.7 g/dL — ABNORMAL LOW (ref 12.0–15.0)
Lymphocytes Relative: 18 % (ref 12.0–46.0)
Lymphs Abs: 1.6 10*3/uL (ref 0.7–4.0)
MCHC: 32 g/dL (ref 30.0–36.0)
MCV: 80.5 fl (ref 78.0–100.0)
Monocytes Absolute: 0.5 10*3/uL (ref 0.1–1.0)
Monocytes Relative: 5.6 % (ref 3.0–12.0)
Neutro Abs: 6.3 10*3/uL (ref 1.4–7.7)
Neutrophils Relative %: 72.5 % (ref 43.0–77.0)
Platelets: 319 10*3/uL (ref 150.0–400.0)
RBC: 4.55 Mil/uL (ref 3.87–5.11)
RDW: 14.4 % (ref 11.5–15.5)
WBC: 8.7 10*3/uL (ref 4.0–10.5)

## 2021-08-10 LAB — LIPID PANEL
Cholesterol: 182 mg/dL (ref 0–200)
HDL: 67.5 mg/dL (ref >39.00)
LDL Cholesterol: 83 mg/dL (ref 0–99)
NonHDL: 114.91
Total CHOL/HDL Ratio: 3
Triglycerides: 162 mg/dL — ABNORMAL HIGH (ref 0.0–149.0)
VLDL: 32.4 mg/dL (ref 0.0–40.0)

## 2021-08-10 LAB — COMPREHENSIVE METABOLIC PANEL
ALT: 19 U/L (ref 0–35)
AST: 18 U/L (ref 0–37)
Albumin: 4.2 g/dL (ref 3.5–5.2)
Alkaline Phosphatase: 91 U/L (ref 39–117)
BUN: 14 mg/dL (ref 6–23)
CO2: 27 mEq/L (ref 19–32)
Calcium: 9.2 mg/dL (ref 8.4–10.5)
Chloride: 100 mEq/L (ref 96–112)
Creatinine, Ser: 0.55 mg/dL (ref 0.40–1.20)
GFR: 105.88 mL/min (ref >60.00)
Glucose, Bld: 122 mg/dL — ABNORMAL HIGH (ref 70–99)
Potassium: 4.2 mEq/L (ref 3.5–5.1)
Sodium: 136 mEq/L (ref 135–145)
Total Bilirubin: 0.7 mg/dL (ref 0.2–1.2)
Total Protein: 7.2 g/dL (ref 6.0–8.3)

## 2021-08-10 LAB — HEMOGLOBIN A1C: Hgb A1c MFr Bld: 6.7 % — ABNORMAL HIGH (ref 4.6–6.5)

## 2021-08-10 NOTE — Patient Instructions (Signed)
Good to see you today! I will call with lab results.  Keep working on exercise and dietary changes. You're so close to under 200 - you can do it!!

## 2021-08-10 NOTE — Progress Notes (Signed)
Subjective:    Patient ID: Anna [name], female    DOB: 15-Jul-1971, 49 y.o.   MRN: [id number]  No chief complaint on file.   HPI Patient is in today for annual exam.  Acute concerns: Bilateral morton's neuroma, recently had steroid injection; follows up with [facility]   Health maintenance: Lifestyle/ exercise: Bilateral Morton's Neuroma, exercise limited currently and she is working on a rental house Nutrition: Doing well, cooking at home and making good choices Mental health: No concerns  Caffeine: Limited, drinks mostly water  Sleep: Getting better per patient Substance use: None  Sexual activity: Married, monogamous, no concerns  Immunizations: Declines flu vaccines Colonoscopy: UTD 03/29/2018 Pap: Sees GYN - UTD on paps Mammogram: UTD with GYN  Specialists: Dr. Radene Knee, Dr. Carlean Purl  Menstrual cycles: Still having periods, irregular, not as heavy, but last period lasted about 12 days    Past Medical History:  Diagnosis Date   Allergy    Endometriosis    Diagnosed at age 33. Followed by Gynecology -- Dr. 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     Past Surgical History:  Procedure Laterality Date   ABLATION ON ENDOMETRIOSIS  1997   COLONOSCOPY  03/2018   deviated setum  1986   ESOPHAGOGASTRODUODENOSCOPY  03/2018   MYOMECTOMY      Family History  Problem Relation Age of Onset   Hypertension Mother    Diabetes Mother    Heart attack Father 68   Lung disease Father    Hypertension Father    Hepatitis C Maternal Uncle    Lung disease Paternal Uncle    Emphysema Maternal Grandmother    Food Allergy Daughter    Asthma Daughter    Colon cancer Neg Hx    Esophageal cancer Neg Hx    Liver cancer Neg Hx    Pancreatic cancer Neg Hx    Stomach cancer Neg Hx    Rectal cancer Neg Hx     Social History    Tobacco Use   Smoking status: Never   Smokeless tobacco: Never  Vaping Use   Vaping Use: Never used  Substance Use Topics   Alcohol use: No   Drug use: No     Allergies  Allergen Reactions   Erythromycin Rash   Nitrofurantoin Rash   Red Dye Rash    Review of Systems  Constitutional:  Negative for activity change, appetite change, fever and unexpected weight change.  HENT:  Negative for congestion.   Eyes:  Negative for visual disturbance.  Respiratory:  Negative for apnea, cough and shortness of breath.   Cardiovascular:  Negative for chest pain, palpitations and leg swelling.  Gastrointestinal:  Negative for abdominal pain, blood in stool, constipation and diarrhea.  Endocrine: Negative for polydipsia, polyphagia and polyuria.  Genitourinary:  Positive for menstrual problem (irregular). Negative for dysuria and pelvic pain.  Musculoskeletal:  Positive for arthralgias.  Skin:  Negative for rash.  Neurological:  Negative for dizziness, weakness and headaches.  Hematological:  Negative for adenopathy. Does not bruise/bleed easily.  Psychiatric/Behavioral:  Negative for sleep disturbance and suicidal ideas. The patient is not nervous/anxious.        Objective:     BP 129/78   Pulse 89   Temp 97.7 F (36.5 C)  Ht 5' 3.5" (1.613 m)   Wt 200 lb 4 oz (90.8 kg)   SpO2 97%   BMI 34.92 kg/m   Wt Readings from Last 3 Encounters:  08/10/21 200 lb 4 oz (90.8 kg)  05/31/21 204 lb 3.2 oz (92.6 kg)  01/27/21 202 lb 3.2 oz (91.7 kg)    BP Readings from Last 3 Encounters:  08/10/21 129/78  05/31/21 (!) 146/83  01/27/21 137/83     Physical Exam Vitals and nursing note reviewed.  Constitutional:      Appearance: Normal appearance. She is obese. She is not toxic-appearing.  HENT:     Head: Normocephalic and atraumatic.     Right Ear: Tympanic membrane, ear canal and external ear normal.     Left Ear: Tympanic membrane, ear canal and external ear normal.     Nose:  Nose normal.     Mouth/Throat:     Mouth: Mucous membranes are moist.  Eyes:     Extraocular Movements: Extraocular movements intact.     Conjunctiva/sclera: Conjunctivae normal.     Pupils: Pupils are equal, round, and reactive to light.  Cardiovascular:     Rate and Rhythm: Normal rate and regular rhythm.     Pulses: Normal pulses.     Heart sounds: Normal heart sounds.  Pulmonary:     Effort: Pulmonary effort is normal.     Breath sounds: Normal breath sounds.  Abdominal:     General: Abdomen is flat. Bowel sounds are normal.     Palpations: Abdomen is soft.  Musculoskeletal:        General: Normal range of motion.     Cervical back: Normal range of motion and neck supple.  Skin:    General: Skin is warm and dry.     Comments: Numerous sun aging spots  Neurological:     General: No focal deficit present.     Mental Status: She is alert and oriented to person, place, and time.  Psychiatric:        Mood and Affect: Mood normal.        Behavior: Behavior normal.        Thought Content: Thought content normal.        Judgment: Judgment normal.       Assessment & Plan:   Problem List Items Addressed This Visit       Other   Absolute anemia   Relevant Orders   CBC with Differential/Platelet   Other Visit Diagnoses     Encounter for annual physical exam    -  Primary   Relevant Orders   CBC with Differential/Platelet   Comprehensive metabolic panel   Lipid panel   Hemoglobin A1c   Essential hypertension       Relevant Orders   CBC with Differential/Platelet   Comprehensive metabolic panel   Diabetes mellitus screening       Relevant Orders   Comprehensive metabolic panel   Hemoglobin A1c   Screening for cholesterol level       Relevant Orders   Lipid panel       Age-appropriate screening and counseling performed today. Will check labs and call with results. Preventive measures discussed and printed in AVS for patient. She is UTD on preventive measures.  Working on weight loss. Doing great with diet changes. She continues to monitor BP at home.    Anna [name] M Anna Harren, PA-C

## 2021-08-11 ENCOUNTER — Ambulatory Visit: Payer: 59 | Admitting: Podiatry

## 2021-08-13 ENCOUNTER — Other Ambulatory Visit: Payer: Self-pay | Admitting: Podiatry

## 2021-08-25 ENCOUNTER — Ambulatory Visit (INDEPENDENT_AMBULATORY_CARE_PROVIDER_SITE_OTHER): Payer: 59 | Admitting: Podiatry

## 2021-08-25 ENCOUNTER — Other Ambulatory Visit: Payer: Self-pay

## 2021-08-25 DIAGNOSIS — M778 Other enthesopathies, not elsewhere classified: Secondary | ICD-10-CM

## 2021-08-25 DIAGNOSIS — D3610 Benign neoplasm of peripheral nerves and autonomic nervous system, unspecified: Secondary | ICD-10-CM

## 2021-08-25 MED ORDER — TRIAMCINOLONE ACETONIDE 10 MG/ML IJ SUSP
10.0000 mg | Freq: Once | INTRAMUSCULAR | Status: AC
  Administered 2021-08-25: 10 mg

## 2021-08-28 NOTE — Progress Notes (Signed)
Subjective: 50 year old female presents the office today for follow-up evaluation of right foot pain.  She said the injection did help some but did not completely eliminate her pain.  No recent injury or trauma.  She stopped taking the meloxicam as well.  No recent injury or changes otherwise since I last saw her.  No new concerns.  Objective: AAO x3, NAD DP/PT pulses palpable bilaterally, CRT less than 3 seconds To the majority of tenderness is localized to the right foot first interspace there is localized edema to this area there is no erythema.  There is no area pinpoint tenderness.  Mild discomfort second interspace but that appears to be improved.  Flexor, extensor tendons appear to be intact. No edema, erythema, increase in warmth to bilateral lower extremities.  No open lesions or pre-ulcerative lesions.  No pain with calf compression, swelling, warmth, erythema  Assessment: Capsulitis/neuroma right foot  Plan: -All treatment options discussed with the patient including all alternatives, risks, complications.  -Steroid just performed today to the first interspace after discussion regards to risks of injection.  Skin was prepped with alcohol and 1 cc Kenalog 10, 0.5 cc of Marcaine plain, 0.5 cc of lidocaine plain was infiltrated into the first interspace on the area of maximal tenderness today with any complications.  Postinjection care discussed.  Discussed to try to limit activity for last couple of days. -Given ongoing discomfort will order MRI of the right foot as well. -Patient encouraged to call the office with any questions, concerns, change in symptoms.   Trula Slade DPM

## 2021-08-31 ENCOUNTER — Telehealth: Payer: Self-pay | Admitting: *Deleted

## 2021-08-31 NOTE — Telephone Encounter (Signed)
Patient has an appointment on 09-20-2021 at [facility] imaging. Anna [name]

## 2021-08-31 NOTE — Telephone Encounter (Signed)
Called patient to notify that someone was gonna call her today to schedule MRI, said that she had just got off the phone from their office and has been scheduled for 09/20/21@11 :30am.

## 2021-08-31 NOTE — Telephone Encounter (Signed)
-----   Message from Trula Slade, DPM sent at 08/28/2021  8:59 AM EST ----- I ordered MRI of the right foot if someone can please follow-up on this.  Thank you.

## 2021-09-20 ENCOUNTER — Other Ambulatory Visit: Payer: Self-pay

## 2021-09-20 ENCOUNTER — Ambulatory Visit
Admission: RE | Admit: 2021-09-20 | Discharge: 2021-09-20 | Disposition: A | Discharge status: Home / Self Care | Payer: 59 | Source: Ambulatory Visit | Attending: Podiatry | Admitting: Podiatry

## 2021-09-20 DIAGNOSIS — M778 Other enthesopathies, not elsewhere classified: Secondary | ICD-10-CM

## 2021-09-27 ENCOUNTER — Other Ambulatory Visit: Payer: Self-pay

## 2021-09-27 ENCOUNTER — Ambulatory Visit (INDEPENDENT_AMBULATORY_CARE_PROVIDER_SITE_OTHER): Payer: 59 | Admitting: Podiatry

## 2021-09-27 DIAGNOSIS — M778 Other enthesopathies, not elsewhere classified: Secondary | ICD-10-CM

## 2021-09-27 DIAGNOSIS — M7742 Metatarsalgia, left foot: Secondary | ICD-10-CM | POA: Diagnosis not present

## 2021-09-27 DIAGNOSIS — D3610 Benign neoplasm of peripheral nerves and autonomic nervous system, unspecified: Secondary | ICD-10-CM

## 2021-09-27 DIAGNOSIS — M7741 Metatarsalgia, right foot: Secondary | ICD-10-CM | POA: Diagnosis not present

## 2021-09-28 ENCOUNTER — Telehealth: Payer: Self-pay | Admitting: *Deleted

## 2021-09-30 NOTE — Progress Notes (Signed)
Subjective: 50 year old female presents the office today for Evaluation of right foot pain and discuss MRI results.  She states that she still about the same and still having tenderness and get swelling as well as pain into the toes.  No recent injury or fall that she reports.  She has no new concerns.  Objective: AAO x3, NAD DP/PT pulses palpable bilaterally, CRT less than 3 seconds There is tenderness palpation right foot along the second interspace mostly.  Small palpable neuroma identified.  There is mild edema present.  There is no erythema or warmth associate with this.  Flexor, extensor tendons appear to be intact.  MMT 5/5.   No pain with calf compression, swelling, warmth, erythema  Assessment: Neuroma, bursitis  Plan: -All treatment options discussed with the patient including all alternatives, risks, complications.  -Follow-up on further steroid injection today.  Discussed with conservative as well as surgical treatment options.  Referral to physical therapy to include dry needling.  Metatarsal pads and discussed supportive shoe gear. -Patient encouraged to call the office with any questions, concerns, change in symptoms.   Trula Slade DPM

## 2021-10-04 ENCOUNTER — Telehealth: Payer: Self-pay

## 2021-10-04 NOTE — Telephone Encounter (Signed)
MEDICATION: losartan (COZAAR) 25 MG tablet  PHARMACY:  [facility] [id number] - [place], [place] Phone:  [phone]  Fax:  [phone]      Comments:   **Let patient know to contact pharmacy at the end of the day to make sure medication is ready. **  ** Please notify patient to allow 48-72 hours to process**  **Encourage patient to contact the pharmacy for refills or they can request refills through [facility]**

## 2021-10-05 ENCOUNTER — Other Ambulatory Visit: Payer: Self-pay

## 2021-10-05 MED ORDER — LOSARTAN POTASSIUM 25 MG PO TABS: ORAL_TABLET | ORAL | 0 refills | Status: DC

## 2021-10-05 NOTE — Telephone Encounter (Signed)
Rx sent in

## 2021-10-07 ENCOUNTER — Ambulatory Visit: Payer: 59 | Admitting: Physician Assistant

## 2021-10-07 ENCOUNTER — Other Ambulatory Visit: Payer: Self-pay

## 2021-10-07 VITALS — BP 124/78 | HR 75 | Temp 97.7°F | Ht 63.5 in | Wt 197.2 lb

## 2021-10-07 DIAGNOSIS — R160 Hepatomegaly, not elsewhere classified: Secondary | ICD-10-CM | POA: Diagnosis not present

## 2021-10-07 DIAGNOSIS — K760 Fatty (change of) liver, not elsewhere classified: Secondary | ICD-10-CM

## 2021-10-07 DIAGNOSIS — D134 Benign neoplasm of liver: Secondary | ICD-10-CM

## 2021-10-07 NOTE — Progress Notes (Signed)
Subjective:    Patient ID: Anna [name], female    DOB: 1971-06-07, 50 y.o.   MRN: [id number]  Chief Complaint  Patient presents with   Referral    HPI Patient is in today for referral to Dr. Patsy Baltimore at [facility]. She wants to see him about her gallbladder and liver lesions.  Last MRI was 08/24/2020: IMPRESSION: 1. Multiple hepatic lesions again noted, stable to slightly smaller in size since 04/08/2019. Relative stability since 2019 suggests benign etiology with imaging features suggesting multiple adenomas. 2. Hepatic steatosis. 3. Cholelithiasis.  No symptoms today. Denies any abdominal pain. No Nausea or vomiting. No fever or chills. No unintentional weight loss.   Past Medical History:  Diagnosis Date   Allergy    Endometriosis    Diagnosed at age 69. Followed by Gynecology -- Dr. 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     Past Surgical History:  Procedure Laterality Date   ABLATION ON ENDOMETRIOSIS  1997   COLONOSCOPY  03/2018   deviated setum  1986   ESOPHAGOGASTRODUODENOSCOPY  03/2018   MYOMECTOMY      Family History  Problem Relation Age of Onset   Hypertension Mother    Diabetes Mother    Heart attack Father 35   Lung disease Father    Hypertension Father    Hepatitis C Maternal Uncle    Lung disease Paternal Uncle    Emphysema Maternal Grandmother    Food Allergy Daughter    Asthma Daughter    Colon cancer Neg Hx    Esophageal cancer Neg Hx    Liver cancer Neg Hx    Pancreatic cancer Neg Hx    Stomach cancer Neg Hx    Rectal cancer Neg Hx     Social History   Tobacco Use   Smoking status: Never   Smokeless tobacco: Never  Vaping Use   Vaping Use: Never used  Substance Use Topics   Alcohol use: No   Drug use: No     Allergies  Allergen Reactions   Erythromycin Rash    Nitrofurantoin Rash   Red Dye Rash    Review of Systems NEGATIVE UNLESS OTHERWISE INDICATED IN HPI      Objective:     BP 124/78    Pulse 75    Temp 97.7 F (36.5 C)    Ht 5' 3.5" (1.613 m)    Wt 197 lb 3.2 oz (89.4 kg)    SpO2 97%    BMI 34.38 kg/m   Wt Readings from Last 3 Encounters:  10/07/21 197 lb 3.2 oz (89.4 kg)  08/10/21 200 lb 4 oz (90.8 kg)  05/31/21 204 lb 3.2 oz (92.6 kg)    BP Readings from Last 3 Encounters:  10/07/21 124/78  08/10/21 129/78  05/31/21 (!) 146/83     Physical Exam Vitals and nursing note reviewed.  Constitutional:      Appearance: Normal appearance. She is normal weight. She is not toxic-appearing.  HENT:     Head: Normocephalic and atraumatic.     Right Ear: External ear normal.     Left Ear: External ear normal.     Nose: Nose normal.     Mouth/Throat:     Mouth: Mucous membranes are moist.  Eyes:  Extraocular Movements: Extraocular movements intact.     Conjunctiva/sclera: Conjunctivae normal.     Pupils: Pupils are equal, round, and reactive to light.  Cardiovascular:     Rate and Rhythm: Normal rate and regular rhythm.     Pulses: Normal pulses.     Heart sounds: Normal heart sounds.  Pulmonary:     Effort: Pulmonary effort is normal.     Breath sounds: Normal breath sounds.  Abdominal:     General: Abdomen is flat. Bowel sounds are normal.     Palpations: Abdomen is soft.  Musculoskeletal:        General: Normal range of motion.     Cervical back: Normal range of motion and neck supple.  Skin:    General: Skin is warm and dry.  Neurological:     General: No focal deficit present.     Mental Status: She is alert and oriented to person, place, and time.  Psychiatric:        Mood and Affect: Mood normal.        Behavior: Behavior normal.        Thought Content: Thought content normal.        Judgment: Judgment normal.       Assessment & Plan:   Problem List Items Addressed This Visit       Digestive    Hepatic adenoma - Primary   NAFLD (nonalcoholic fatty liver disease)     Other   Liver mass, right lobe    Plan: -Will send referral as requested -Pt knows to seek urgent care should she have any symptoms that present such as abdominal pain, n/v/fever, etc. -She will f/up with me in February for regular recheck and fasting labs.     Daritza Brees M Anjali Manzella, PA-C

## 2021-10-19 ENCOUNTER — Telehealth: Payer: Self-pay

## 2021-10-19 NOTE — Telephone Encounter (Signed)
Error

## 2021-10-29 ENCOUNTER — Emergency Department (HOSPITAL_BASED_OUTPATIENT_CLINIC_OR_DEPARTMENT_OTHER)
Admission: EM | Admit: 2021-10-29 | Discharge: 2021-10-30 | Disposition: A | Discharge status: Home / Self Care | Payer: Commercial Managed Care - HMO | Attending: Emergency Medicine | Admitting: Emergency Medicine

## 2021-10-29 ENCOUNTER — Encounter (HOSPITAL_BASED_OUTPATIENT_CLINIC_OR_DEPARTMENT_OTHER): Payer: Self-pay

## 2021-10-29 ENCOUNTER — Other Ambulatory Visit: Payer: Self-pay

## 2021-10-29 DIAGNOSIS — I10 Essential (primary) hypertension: Secondary | ICD-10-CM | POA: Diagnosis not present

## 2021-10-29 DIAGNOSIS — Z79899 Other long term (current) drug therapy: Secondary | ICD-10-CM | POA: Insufficient documentation

## 2021-10-29 DIAGNOSIS — R030 Elevated blood-pressure reading, without diagnosis of hypertension: Secondary | ICD-10-CM | POA: Diagnosis present

## 2021-10-29 LAB — CBC
HCT: 36.8 % (ref 36.0–46.0)
Hemoglobin: 11.7 g/dL — ABNORMAL LOW (ref 12.0–15.0)
MCH: 25.7 pg — ABNORMAL LOW (ref 26.0–34.0)
MCHC: 31.8 g/dL (ref 30.0–36.0)
MCV: 80.7 fL (ref 80.0–100.0)
Platelets: 267 10*3/uL (ref 150–400)
RBC: 4.56 MIL/uL (ref 3.87–5.11)
RDW: 13.7 % (ref 11.5–15.5)
WBC: 10.4 10*3/uL (ref 4.0–10.5)
nRBC: 0 % (ref 0.0–0.2)

## 2021-10-29 LAB — BASIC METABOLIC PANEL
Anion gap: 11 (ref 5–15)
BUN: 12 mg/dL (ref 6–20)
CO2: 24 mmol/L (ref 22–32)
Calcium: 9.4 mg/dL (ref 8.9–10.3)
Chloride: 100 mmol/L (ref 98–111)
Creatinine, Ser: 0.56 mg/dL (ref 0.44–1.00)
GFR, Estimated: >60 mL/min (ref >60)
Glucose, Bld: 124 mg/dL — ABNORMAL HIGH (ref 70–99)
Potassium: 4.1 mmol/L (ref 3.5–5.1)
Sodium: 135 mmol/L (ref 135–145)

## 2021-10-29 NOTE — ED Triage Notes (Signed)
Pt c/o elevated B/P today. Pt reports associated HA. Pt denies CP, ShOB, or vision changes. Pt denies recent changes to medication.

## 2021-10-30 NOTE — ED Provider Notes (Signed)
[facility] EMERGENCY DEPT Provider Note   CSN: [id number] Arrival date & time: 10/29/21  2114     History  Chief Complaint  Patient presents with   Hypertension    Anna [name] is a 51 y.o. female.  Patient is a 51 year old female with history of hypertension.  She presents with complaints of elevated blood pressure.  Patient routinely checks her blood pressures at home and was getting readings today of 180/105.  She describes some headache, but no other symptoms such as chest pain or shortness of breath.  She reports being compliant with her blood pressure medication and denies any increased physical or emotional stress recently.  The history is provided by the patient.  Hypertension This is a new problem. The current episode started 6 to 12 hours ago. The problem occurs constantly. The problem has been gradually worsening. Associated symptoms include headaches. Pertinent negatives include no chest pain and no shortness of breath. Nothing aggravates the symptoms. Nothing relieves the symptoms. She has tried nothing for the symptoms.      Home Medications Prior to Admission medications   Medication Sig Start Date End Date Taking? Authorizing Provider  amLODipine (NORVASC) 2.5 MG tablet Take 2.5 mg by mouth daily. 08/15/20   [provider]  carvedilol (COREG) 6.25 MG tablet Take by mouth. 05/17/20   [provider]  cholecalciferol (VITAMIN D3) 25 MCG (1000 UNIT) tablet Take 1,000 Units by mouth daily.    [provider]  ELDERBERRY PO Take by mouth.    [provider]  ferrous sulfate 325 (65 FE) MG tablet Take 1 tablet (325 mg total) by mouth 2 (two) times daily with a meal. 05/20/20   Brunetta Jeans, PA-C  losartan (COZAAR) 25 MG tablet TAKE 1 TABLET BY MOUTH IN THE MORNING AND AT BEDTIME 10/05/21   Allwardt, Alyssa M, PA-C  meloxicam (MOBIC) 15 MG tablet TAKE ONE TABLET BY MOUTH DAILY 08/13/21   Trula Slade, DPM       Allergies    Erythromycin, Nitrofurantoin, and Red dye    Review of Systems   Review of Systems  Respiratory:  Negative for shortness of breath.   Cardiovascular:  Negative for chest pain.  Neurological:  Positive for headaches.  All other systems reviewed and are negative.  Physical Exam Updated Vital Signs BP (!) 144/81    Pulse 93    Temp 98.3 F (36.8 C)    Resp 18    Ht 5\' 3"  (1.6 m)    Wt 88.5 kg    LMP 10/19/2021    SpO2 100%    BMI 34.54 kg/m  Physical Exam Vitals and nursing note reviewed.  Constitutional:      General: She is not in acute distress.    Appearance: She is well-developed. She is not diaphoretic.  HENT:     Head: Normocephalic and atraumatic.  Eyes:     Extraocular Movements: Extraocular movements intact.     Pupils: Pupils are equal, round, and reactive to light.  Cardiovascular:     Rate and Rhythm: Normal rate and regular rhythm.     Heart sounds: No murmur heard.   No friction rub. No gallop.  Pulmonary:     Effort: Pulmonary effort is normal. No respiratory distress.     Breath sounds: Normal breath sounds. No wheezing.  Abdominal:     General: Bowel sounds are normal. There is no distension.     Palpations: Abdomen is soft.  Tenderness: There is no abdominal tenderness.  Musculoskeletal:        General: Normal range of motion.     Cervical back: Normal range of motion and neck supple.  Skin:    General: Skin is warm and dry.  Neurological:     General: No focal deficit present.     Mental Status: She is alert and oriented to person, place, and time.     Cranial Nerves: No cranial nerve deficit.    ED Results / Procedures / Treatments   Labs (all labs ordered are listed, but only abnormal results are displayed) Labs Reviewed  BASIC METABOLIC PANEL - Abnormal; Notable for the following components:      Result Value   Glucose, Bld 124 (*)    All other components within normal limits  CBC - Abnormal; Notable for the following  components:   Hemoglobin 11.7 (*)    MCH 25.7 (*)    All other components within normal limits    EKG EKG Interpretation  Date/Time:  Saturday October 29 2021 21:42:31 EST Ventricular Rate:  85 PR Interval:  158 QRS Duration: 80 QT Interval:  368 QTC Calculation: 437 R Axis:   89 Text Interpretation: Normal sinus rhythm Normal ECG No previous ECGs available Confirmed by Veryl Speak [phone]) on 10/30/2021 12:25:57 AM  Radiology No results found.  Procedures Procedures    Medications Ordered in ED Medications - No data to display  ED Course/ Medical Decision Making/ A&P  Patient presenting here with complaints of elevated blood pressure as described in the HPI.  She is neurologically intact and has no other complaints.  Blood pressure has resolved spontaneously here in the ER.  While I was examining her, blood pressure was 700 systolic.  Her headache is gone.  At this point, patient seems appropriate for discharge.  There is no sign of endorgan damage.  Her laboratory studies are normal and EKG is unchanged.  She is to keep a record of her blood pressures at home and return as needed if symptoms worsen or change.  Final Clinical Impression(s) / ED Diagnoses Final diagnoses:  None    Rx / DC Orders ED Discharge Orders     None         Veryl Speak, MD 10/30/21 [phone]

## 2021-10-30 NOTE — Discharge Instructions (Signed)
Continue monitoring your blood pressure at home.  Return to the ER for blood pressures sustained over 200, severe headache, chest pain, or other new and concerning symptoms.  Keep a record of your blood pressures and follow-up with your doctor in the next 1 to 2 weeks.

## 2021-10-31 NOTE — Telephone Encounter (Signed)
Error message

## 2021-11-01 ENCOUNTER — Telehealth: Payer: Managed Care, Other (non HMO) | Admitting: Physician Assistant

## 2021-11-01 NOTE — Telephone Encounter (Signed)
Pt called in and asked for her referral to Dr Normajean Baxter at [facility] be faxed again. They are stating they never received it.

## 2021-11-07 NOTE — Telephone Encounter (Signed)
Patient called advising she has not heard anything from [facility] yet, but mostly would like an MRI referral.  I advised Lattie Haw regarding the [facility] referral and she is looking into it. Please send referral for MRI. Thanks

## 2021-11-07 NOTE — Telephone Encounter (Signed)
See note

## 2021-11-14 NOTE — Telephone Encounter (Signed)
Patient calling back to speak to [facility].

## 2021-11-14 NOTE — Telephone Encounter (Signed)
Left voice message for patient to call clinic.  

## 2021-11-14 NOTE — Telephone Encounter (Signed)
Patient has a appt with Liver specialist.

## 2021-11-21 DIAGNOSIS — K8020 Calculus of gallbladder without cholecystitis without obstruction: Secondary | ICD-10-CM | POA: Insufficient documentation

## 2021-11-27 IMAGING — MR MR ABDOMEN WO/W CM
18 series · 48 of 48 positions shown · IV contrast (eovist)
Comparison: 04/08/2019, 10/12/2018.  05/14/2018.  01/10/2018.

CLINICAL DATA: Hepatic adenomas.

EXAM:
MRI ABDOMEN WITHOUT AND WITH CONTRAST
TECHNIQUE: Multiplanar multisequence MR imaging of the abdomen was performed
both before and after the administration of intravenous contrast.
CONTRAST:  9mL EOVIST GADOXETATE DISODIUM 0.25 MOL/L IV SOLN

[Series 2: T2 · coronal · 6.0mm · 1.56mm/px · 2 of 37 slices shown (1 of 2)]
[im 1/37]
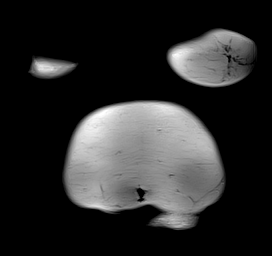
[im 37/37]
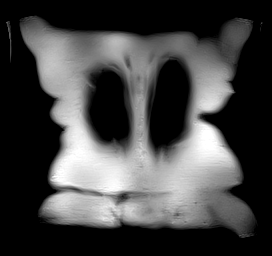

[Series 3: T1 · axial · 3.0mm · 1.25mm/px · z∈[-154,+131]mm · 4 of 96 slices shown (1 of 2)]
[im 1/96]
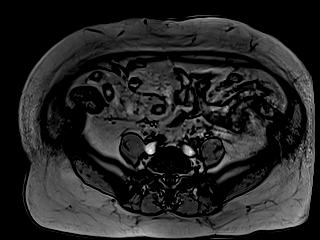
[im 32/96]
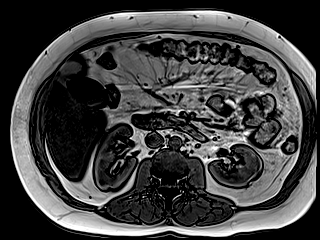
[im 64/96]
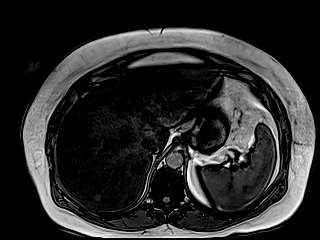
[im 96/96]
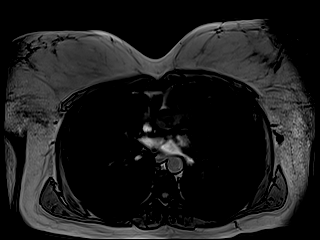

[Series 4: T1 · axial · 3.0mm · 1.25mm/px · z∈[-154,+131]mm · 4 of 96 slices shown (2 of 2)]
[im 1/96]
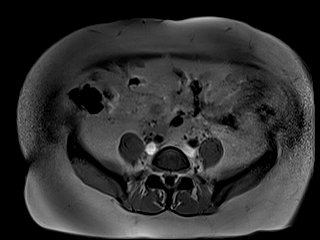
[im 32/96]
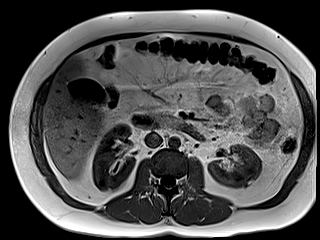
[im 64/96]
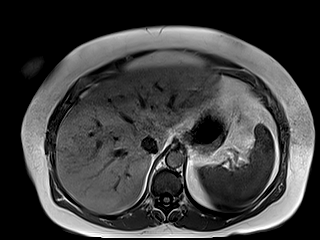
[im 96/96]
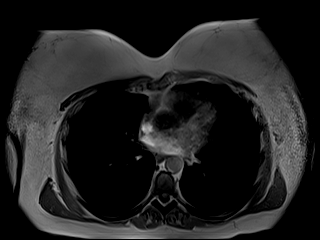

[Series 6: T1 dynamic · axial · 3.0mm · 1.25mm/px · z∈[-159,+126]mm · 4 of 96 slices shown (1 of 5)]
[im 1/96]
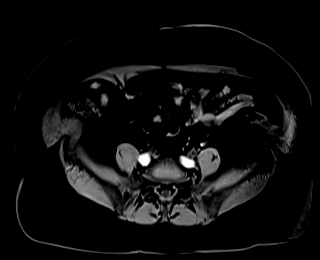
[im 32/96]
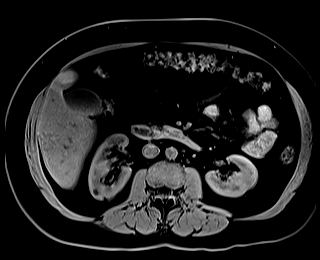
[im 64/96]
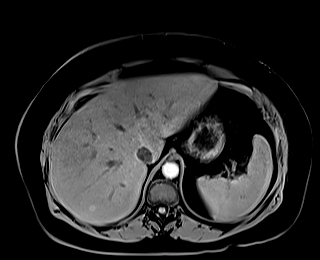
[im 96/96]
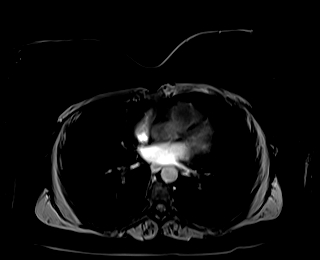

[Series 9: T1 dynamic · axial · 3.0mm · 1.25mm/px · z∈[-159,+126]mm · 3 of 96 slices shown (2 of 5)]
[im 1/96]
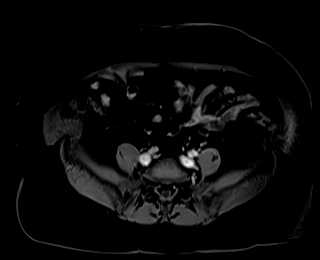
[im 48/96]
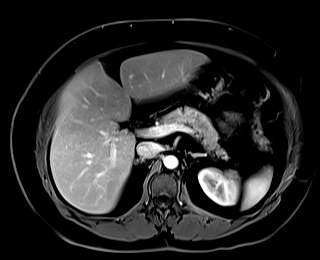
[im 96/96]
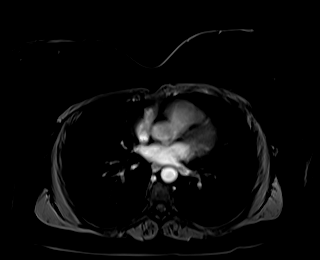

[Series 11: T1 dynamic · axial · 3.0mm · 1.25mm/px · z∈[-159,+126]mm · 3 of 96 slices shown (3 of 5)]
[im 1/96]
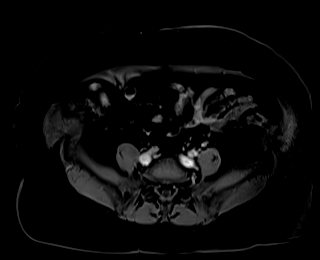
[im 48/96]
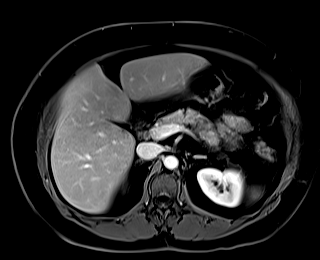
[im 96/96]
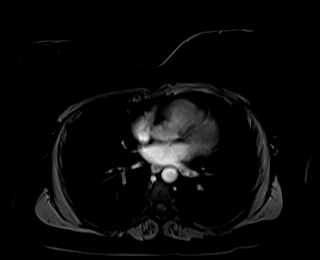

[Series 13: T1 dynamic · axial · 3.0mm · 1.25mm/px · z∈[-159,+126]mm · 3 of 96 slices shown (4 of 5)]
[im 1/96]
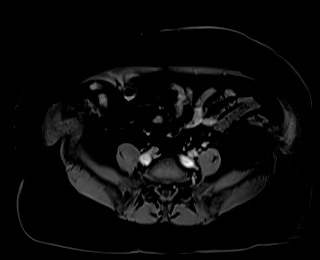
[im 48/96]
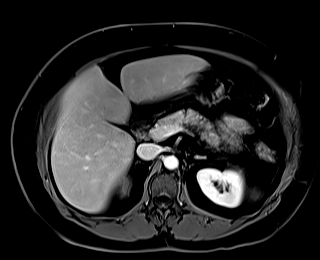
[im 96/96]
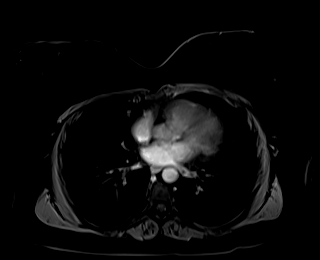

[Series 15: T1 dynamic · coronal · 5.0mm · 1.41mm/px · 2 of 60 slices shown (5 of 5)]
[im 1/60]
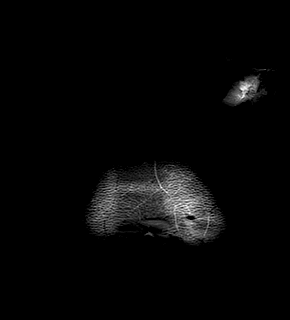
[im 60/60]
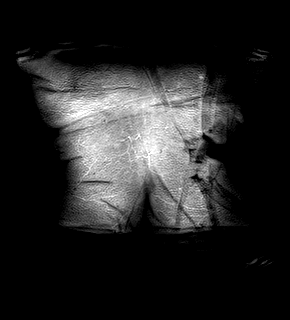

[Series 16: T2 · axial · 6.0mm · 1.56mm/px · 1 of 44 slices shown (2 of 2)]
[im 1/44]
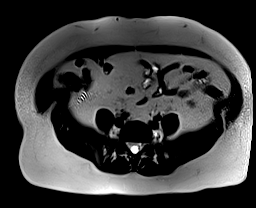

[Series 18: T2 fat-sat · axial · 6.0mm · 0.78mm/px · 1 of 39 slices shown]
[im 1/39]
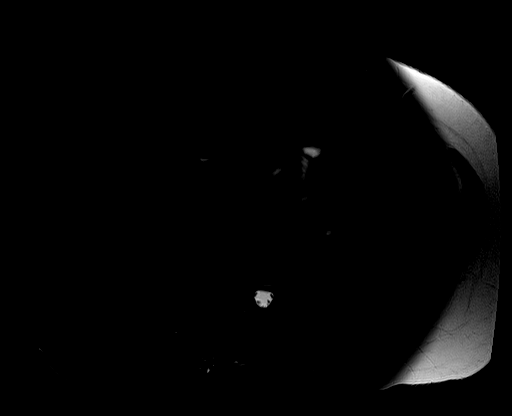

[Series 19: bSSFP · axial · 4.0mm · 0.84mm/px · z∈[-145,+123]mm · 2 of 68 slices shown]
[im 1/68]
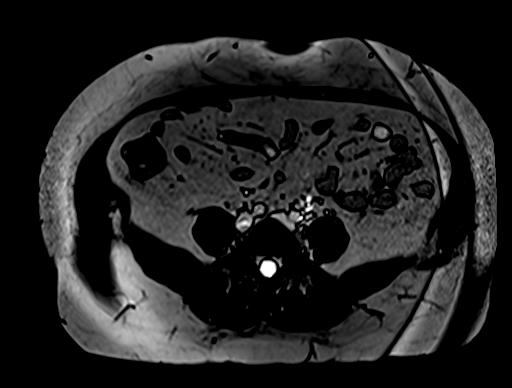
[im 68/68]
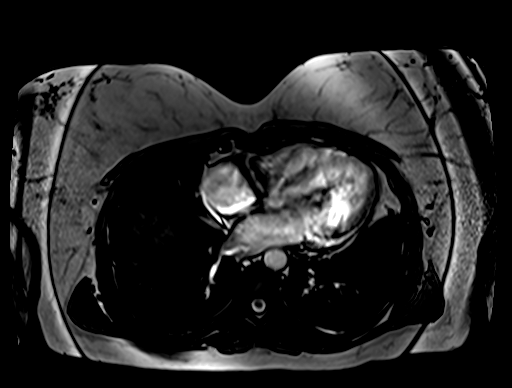

[Series 20: DWI · axial · 6.0mm · 1.57mm/px · z∈[-156,+125]mm · 3 of 80 slices shown (1 of 2)]
[im 1/80]
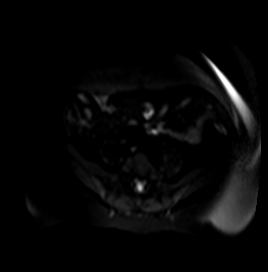
[im 40/80]
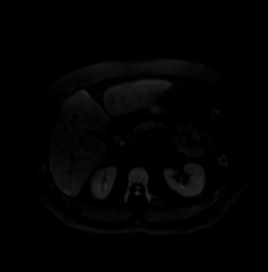
[im 80/80]
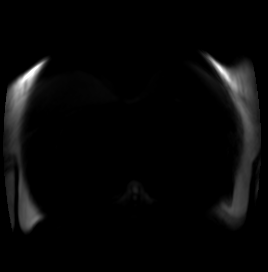

[Series 21: DWI · axial · 6.0mm · 1.57mm/px · 1 of 40 slices shown (2 of 2)]
[im 1/40]
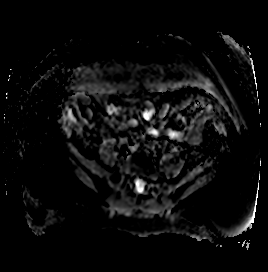

[Series 23: T1 dynamic fat-sat · axial · 3.0mm · 1.25mm/px · z∈[-159,+126]mm · 3 of 96 slices shown]
[im 1/96]
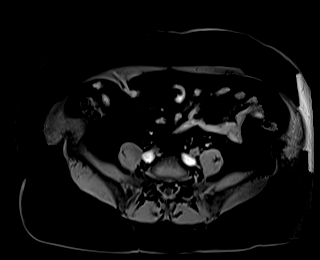
[im 48/96]
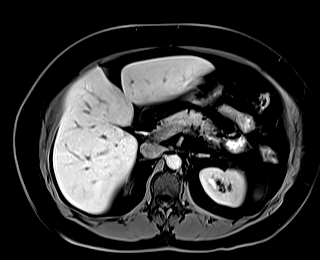
[im 96/96]
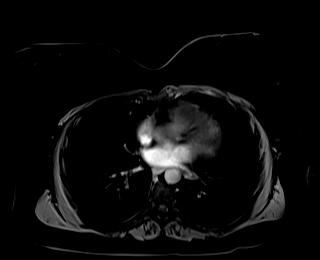

[Series 100: sub 20 sec · axial · 3.0mm · 1.25mm/px · z∈[-159,+126]mm · 3 of 96 slices shown]
[im 1/96]
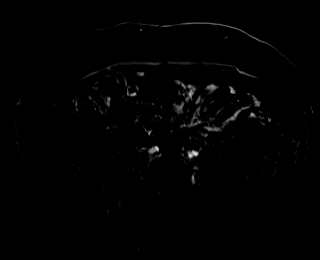
[im 48/96]
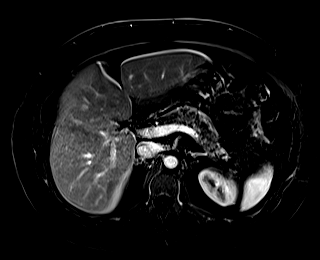
[im 96/96]
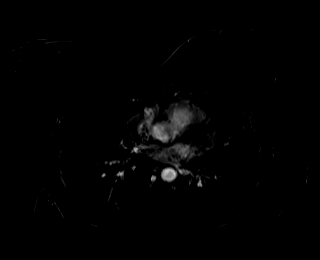

[Series 101: sub 45 sec · axial · 3.0mm · 1.25mm/px · z∈[-159,+126]mm · 3 of 96 slices shown]
[im 1/96]
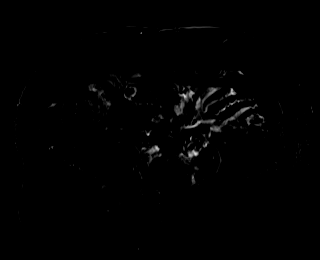
[im 48/96]
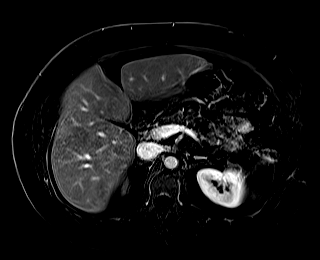
[im 96/96]
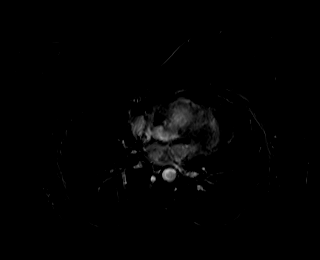

[Series 102: sub 90 sec · axial · 3.0mm · 1.25mm/px · z∈[-159,+126]mm · 3 of 96 slices shown]
[im 1/96]
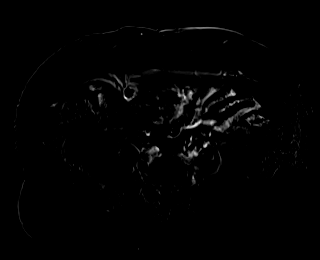
[im 48/96]
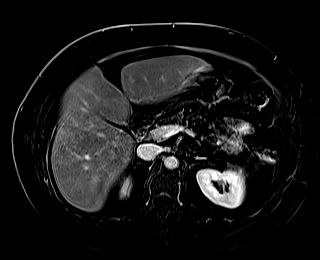
[im 96/96]
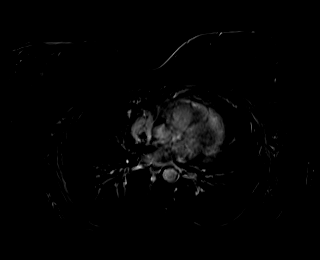

[Series 103: sub (id) · axial · 3.0mm · 1.25mm/px · z∈[-159,+126]mm · 3 of 96 slices shown]
[im 1/96]
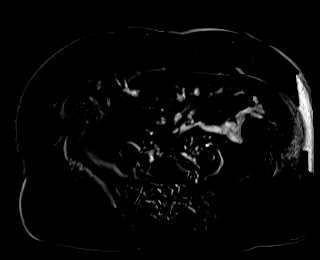
[im 48/96]
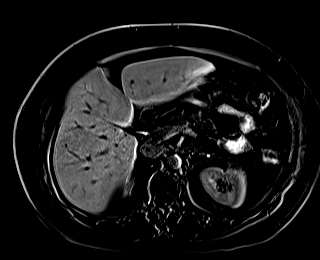
[im 96/96]
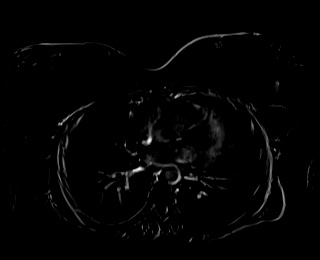

[48 of 48 positions shown; findings below may reference images not displayed]

FINDINGS: Lower chest: Unremarkable.

Hepatobiliary: Loss of signal intensity in the liver parenchyma on
out of phase T1 imaging is consistent with fatty
deposition/steatosis. As on prior studies, the patient has multiple
lesions scattered through both hepatic lobes. Dominant lesion again
noted in the inferior right liver (segment 6) measuring 5.2 x 3.9 cm
on pre contrast T1 image 78 of series 6. This compares to 5.4 x
cm (remeasured) previously. Medial right hepatic lobe lesion
measuring 1.7 cm on 59/6 was 2.0 cm previously. Additional scattered
tiny lesions are similar in size with similar pre contrast T1 signal
characteristics noted in both hepatic lobes.

After IV contrast administration, these lesions show no
hypervascularity on subtraction imaging. 20 minutes delayed imaging
shows washout in the lesions to a greater degree than background
liver parenchyma. Lesions all show similar enhancement
characteristics in washout on 20 minutes delayed imaging argues
against FNH as etiology.

Gallstone again identified.  No biliary dilatation.

Pancreas: No focal mass lesion. No dilatation of the main duct. No
intraparenchymal cyst. No peripancreatic edema.

Spleen:  No splenomegaly. No focal mass lesion.

Adrenals/Urinary Tract: No adrenal nodule or mass. Scattered small
renal cysts noted bilaterally.

Stomach/Bowel: Stomach is unremarkable. No gastric wall thickening.
No evidence of outlet obstruction. Duodenum is normally positioned
as is the ligament of Treitz. No small bowel or colonic dilatation
within the visualized abdomen.

Vascular/Lymphatic: No abdominal aortic aneurysm. No abdominal
lymphadenopathy.

Other:  No intraperitoneal free fluid.

Musculoskeletal: No focal suspicious marrow enhancement within the
visualized bony anatomy.
IMPRESSION: 1. Multiple hepatic lesions again noted, stable to slightly smaller
in size since 04/08/2019. Relative stability since 2931 suggests
benign etiology with imaging features suggesting multiple adenomas.
2. Hepatic steatosis.
3. Cholelithiasis.

## 2021-12-01 ENCOUNTER — Ambulatory Visit: Payer: 59 | Admitting: Physician Assistant

## 2021-12-27 ENCOUNTER — Ambulatory Visit: Payer: Self-pay | Admitting: Physician Assistant

## 2021-12-28 ENCOUNTER — Ambulatory Visit: Payer: Managed Care, Other (non HMO) | Admitting: Physician Assistant

## 2022-01-10 ENCOUNTER — Ambulatory Visit (INDEPENDENT_AMBULATORY_CARE_PROVIDER_SITE_OTHER): Payer: Managed Care, Other (non HMO) | Admitting: Physician Assistant

## 2022-01-10 VITALS — BP 133/79 | HR 83 | Temp 98.0°F | Ht 63.0 in | Wt 195.4 lb

## 2022-01-10 DIAGNOSIS — I10 Essential (primary) hypertension: Secondary | ICD-10-CM

## 2022-01-10 DIAGNOSIS — R160 Hepatomegaly, not elsewhere classified: Secondary | ICD-10-CM

## 2022-01-10 DIAGNOSIS — R7303 Prediabetes: Secondary | ICD-10-CM

## 2022-01-10 DIAGNOSIS — D134 Benign neoplasm of liver: Secondary | ICD-10-CM

## 2022-01-10 DIAGNOSIS — R051 Acute cough: Secondary | ICD-10-CM

## 2022-01-10 DIAGNOSIS — K760 Fatty (change of) liver, not elsewhere classified: Secondary | ICD-10-CM | POA: Diagnosis not present

## 2022-01-10 MED ORDER — MELOXICAM 15 MG PO TABS: 15.0000 mg | ORAL_TABLET | Freq: Every day | ORAL | 2 refills | Status: DC

## 2022-01-10 NOTE — Progress Notes (Signed)
? ?Subjective:  ? ? Patient ID: Anna [name], female    DOB: 20-Apr-1971, 51 y.o.   MRN: [id number] ? ?Chief Complaint  ?Patient presents with  ? Follow-up  ? ? ?HPI ?Patient is in today for follow-up on multiple issues. See A/P for details. ? ?Past Medical History:  ?Diagnosis Date  ? Allergy   ? Endometriosis   ? Diagnosed at age 62. Followed by Gynecology -- Dr. Arelia Sneddon  ? Gallstones   ? GERD (gastroesophageal reflux disease)   ? Heart murmur   ? Hypertension   ? Iron deficiency anemia   ? on OTC supplement  ? 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 ? ?Past Surgical History:  ?Procedure Laterality Date  ? ABLATION ON ENDOMETRIOSIS  1997  ? COLONOSCOPY  03/2018  ? deviated setum  1986  ? ESOPHAGOGASTRODUODENOSCOPY  03/2018  ? MYOMECTOMY    ? ? ?Family History  ?Problem Relation Age of Onset  ? Hypertension Mother   ? Diabetes Mother   ? Heart attack Father 28  ? Lung disease Father   ? Hypertension Father   ? Hepatitis C Maternal Uncle   ? Lung disease Paternal Uncle   ? Emphysema Maternal Grandmother   ? Food Allergy Daughter   ? Asthma Daughter   ? Colon cancer Neg Hx   ? Esophageal cancer Neg Hx   ? Liver cancer Neg Hx   ? Pancreatic cancer Neg Hx   ? Stomach cancer Neg Hx   ? Rectal cancer Neg Hx   ? ? ?Social History  ? ?Tobacco Use  ? Smoking status: Never  ? Smokeless tobacco: Never  ?Vaping Use  ? Vaping Use: Never used  ?Substance Use Topics  ? Alcohol use: Yes  ? Drug use: No  ?  ? ?Allergies  ?Allergen Reactions  ? Erythromycin Rash  ? Nitrofurantoin Rash  ? Red Dye Rash  ? ? ?Review of Systems ?NEGATIVE UNLESS OTHERWISE INDICATED IN HPI ? ? ?   ?Objective:  ?  ? ?BP 133/79   Pulse 83   Temp 98 ?F (36.7 ?C)   Ht 5\' 3"  (1.6 m)   Wt 195 lb 6.1 oz (88.6 kg)   SpO2 97%   BMI 34.61 kg/m?  ? ?Wt Readings from Last 3 Encounters:  ?01/10/22 195 lb 6.1 oz (88.6 kg)  ?10/29/21 195 lb (88.5 kg)  ?10/07/21 197 lb 3.2 oz (89.4 kg)  ? ? ?BP Readings from Last 3 Encounters:  ?01/10/22 133/79  ?10/30/21  113/64  ?10/07/21 124/78  ?  ? ?Physical Exam ?Vitals and nursing note reviewed.  ?Constitutional:   ?   Appearance: Normal appearance. She is normal weight. She is not toxic-appearing.  ?HENT:  ?   Head: Normocephalic and atraumatic.  ?   Right Ear: External ear normal.  ?   Left Ear: External ear normal.  ?   Nose: Nose normal.  ?   Mouth/Throat:  ?   Mouth: Mucous membranes are moist.  ?Eyes:  ?   Extraocular Movements: Extraocular movements intact.  ?   Conjunctiva/sclera: Conjunctivae normal.  ?   Pupils: Pupils are equal, round, and reactive to light.  ?Cardiovascular:  ?   Rate and Rhythm: Normal rate and regular rhythm.  ?   Pulses: Normal pulses.  ?   Heart sounds: Normal heart sounds.  ?Pulmonary:  ?   Effort: Pulmonary effort is normal.  ?   Breath sounds: Normal breath sounds.  ?  Comments: Paroxysmal dry cough ?Abdominal:  ?   General: Abdomen is flat. Bowel sounds are normal.  ?   Palpations: Abdomen is soft.  ?Musculoskeletal:     ?   General: Normal range of motion.  ?   Cervical back: Normal range of motion and neck supple.  ?Skin: ?   General: Skin is warm and dry.  ?Neurological:  ?   General: No focal deficit present.  ?   Mental Status: She is alert and oriented to person, place, and time.  ?Psychiatric:     ?   Mood and Affect: Mood normal.     ?   Behavior: Behavior normal.     ?   Thought Content: Thought content normal.     ?   Judgment: Judgment normal.  ? ? ?   ?Assessment & Plan:  ? ?Problem List Items Addressed This Visit   ? ?  ? Digestive  ? Hepatic adenoma  ? Relevant Orders  ? MR LIVER W WO CONTRAST  ? NAFLD (nonalcoholic fatty liver disease)  ? Relevant Orders  ? MR LIVER W WO CONTRAST  ?  ? Other  ? Liver mass, right lobe - Primary  ? Relevant Orders  ? MR LIVER W WO CONTRAST  ? ?Other Visit Diagnoses   ? ? Acute cough      ? Essential hypertension      ? Prediabetes      ? ?  ? ? ? ?Meds ordered this encounter  ?Medications  ? meloxicam (MOBIC) 15 MG tablet  ?  Sig: Take 1 tablet  (15 mg total) by mouth daily.  ?  Dispense:  30 tablet  ?  Refill:  2  ? ?1. Liver mass, right lobe ?2. Hepatic adenoma ?3. NAFLD (nonalcoholic fatty liver disease) ?Patient met with Dr. Carlis Abbott on 11/21/21, with previous visit two years ago prior to that. He suggested in A/P that updated labs and MRI would be recommended to determine next steps. Pt tells me that MRI cost of liver is not covered because specialist is not in-network. She requested I place new order for MRI today, which I placed for her today.  ? ?4. Acute cough ?Allergies vs viral etiology, no red flags. ?Tussionex at bedtime as pt requested, Pt aware of risks vs benefits and possible adverse reactions. ? ?5. Essential hypertension ?Stable, to goal ?Cont losartan 25 mg qd and norvasc 2.5 mg qd  ?Monitor at home ? ?6. Prediabetes ?11/21/21 - 6.4 hemoglobin A1c  ?She wants to come back for labs. ?She continues to try to work on dietary and lifestyle changes. ? ? ?F/up 3 months or prn  ? ?This note was prepared with assistance of Systems analyst. Occasional wrong-word or sound-a-like substitutions may have occurred due to the inherent limitations of voice recognition software. ? ?  ? ?Stormie Ventola M Jhonatan Lomeli, PA-C ?

## 2022-01-11 ENCOUNTER — Telehealth: Payer: Self-pay | Admitting: Physician Assistant

## 2022-01-11 NOTE — Telephone Encounter (Signed)
Patient stated she was seen on 01/10/22- stated her cough medicine has not been called in. Would like that being taken care of today if possible.  ?

## 2022-01-12 ENCOUNTER — Other Ambulatory Visit: Payer: Self-pay | Admitting: Physician Assistant

## 2022-01-12 MED ORDER — HYDROCOD POLI-CHLORPHE POLI ER 10-8 MG/5ML PO SUER: 5.0000 mL | Freq: Every evening | ORAL | 0 refills | Status: DC | PRN

## 2022-01-16 NOTE — Telephone Encounter (Signed)
Pt is stating rx was sent to wrong pharmacy. Please resend to [facility] on [address].  ? ?MEDICATION:chlorpheniramine-HYDROcodone (TUSSIONEX PENNKINETIC ER) 10-8 ?

## 2022-01-18 NOTE — Telephone Encounter (Signed)
Pt's medication still has not been sent to the right pharmacy. Her insurance does not cover [facility]. ? ?PHARMACY:  ?[place], [place] - [address] Phone:  [phone]  ?Fax:  [phone]  ?  ? ?

## 2022-01-19 NOTE — Telephone Encounter (Signed)
Patient is calling back in regard.  Looks like script is still at [facility].  Patient does not want to pick up at [facility].    Is requesting Kristopher Oppenheim to be removed from Pharmacy list.  Please give a call back asap at [phone].

## 2022-01-20 ENCOUNTER — Other Ambulatory Visit: Payer: Self-pay | Admitting: Physician Assistant

## 2022-01-20 MED ORDER — HYDROCOD POLI-CHLORPHE POLI ER 10-8 MG/5ML PO SUER: 5.0000 mL | Freq: Every evening | ORAL | 0 refills | Status: AC | PRN

## 2022-01-20 NOTE — Telephone Encounter (Signed)
Rx sent to [facility]. [facility] pharmacy removed.  ?

## 2022-02-14 ENCOUNTER — Ambulatory Visit
Admission: RE | Admit: 2022-02-14 | Discharge: 2022-02-14 | Disposition: A | Discharge status: Home / Self Care | Payer: Managed Care, Other (non HMO) | Source: Ambulatory Visit | Attending: Physician Assistant | Admitting: Physician Assistant

## 2022-02-14 DIAGNOSIS — K760 Fatty (change of) liver, not elsewhere classified: Secondary | ICD-10-CM

## 2022-02-14 DIAGNOSIS — R160 Hepatomegaly, not elsewhere classified: Secondary | ICD-10-CM

## 2022-02-14 DIAGNOSIS — D134 Benign neoplasm of liver: Secondary | ICD-10-CM

## 2022-02-14 MED ORDER — GADOBENATE DIMEGLUMINE 529 MG/ML IV SOLN: 18.0000 mL | Freq: Once | INTRAVENOUS | Status: DC | PRN

## 2022-02-19 ENCOUNTER — Ambulatory Visit
Admission: RE | Admit: 2022-02-19 | Discharge: 2022-02-19 | Disposition: A | Discharge status: Home / Self Care | Payer: Managed Care, Other (non HMO) | Source: Ambulatory Visit | Attending: Physician Assistant | Admitting: Physician Assistant

## 2022-02-19 MED ORDER — GADOXETATE DISODIUM 0.25 MMOL/ML IV SOLN
9.0000 mL | Freq: Once | INTRAVENOUS | Status: AC | PRN
  Administered 2022-02-19: 9 mL via INTRAVENOUS

## 2022-03-07 ENCOUNTER — Other Ambulatory Visit: Payer: Self-pay | Admitting: Physician Assistant

## 2022-03-28 ENCOUNTER — Other Ambulatory Visit: Payer: Self-pay | Admitting: Physician Assistant

## 2022-04-13 ENCOUNTER — Ambulatory Visit: Payer: Managed Care, Other (non HMO) | Admitting: Physician Assistant

## 2022-04-24 ENCOUNTER — Other Ambulatory Visit: Payer: Self-pay | Admitting: Physician Assistant

## 2022-05-23 ENCOUNTER — Other Ambulatory Visit: Payer: Self-pay | Admitting: Physician Assistant

## 2022-05-31 DIAGNOSIS — M25561 Pain in right knee: Secondary | ICD-10-CM | POA: Insufficient documentation

## 2022-07-10 ENCOUNTER — Encounter: Payer: Self-pay | Admitting: *Deleted

## 2022-08-06 ENCOUNTER — Other Ambulatory Visit: Payer: Self-pay | Admitting: Physician Assistant

## 2022-09-28 ENCOUNTER — Encounter: Payer: Self-pay | Admitting: *Deleted

## 2022-12-06 DIAGNOSIS — M25561 Pain in right knee: Secondary | ICD-10-CM | POA: Diagnosis not present

## 2023-01-04 DIAGNOSIS — L2089 Other atopic dermatitis: Secondary | ICD-10-CM | POA: Diagnosis not present

## 2023-01-04 DIAGNOSIS — L821 Other seborrheic keratosis: Secondary | ICD-10-CM | POA: Diagnosis not present

## 2023-01-04 DIAGNOSIS — L814 Other melanin hyperpigmentation: Secondary | ICD-10-CM | POA: Diagnosis not present

## 2023-01-04 DIAGNOSIS — Z85828 Personal history of other malignant neoplasm of skin: Secondary | ICD-10-CM | POA: Diagnosis not present

## 2023-01-04 DIAGNOSIS — D225 Melanocytic nevi of trunk: Secondary | ICD-10-CM | POA: Diagnosis not present

## 2023-01-04 DIAGNOSIS — Z08 Encounter for follow-up examination after completed treatment for malignant neoplasm: Secondary | ICD-10-CM | POA: Diagnosis not present

## 2023-01-04 DIAGNOSIS — L570 Actinic keratosis: Secondary | ICD-10-CM | POA: Diagnosis not present

## 2023-01-04 DIAGNOSIS — Z872 Personal history of diseases of the skin and subcutaneous tissue: Secondary | ICD-10-CM | POA: Diagnosis not present

## 2023-01-04 DIAGNOSIS — L905 Scar conditions and fibrosis of skin: Secondary | ICD-10-CM | POA: Diagnosis not present

## 2023-01-04 DIAGNOSIS — B351 Tinea unguium: Secondary | ICD-10-CM | POA: Diagnosis not present

## 2023-01-24 DIAGNOSIS — M25561 Pain in right knee: Secondary | ICD-10-CM | POA: Diagnosis not present

## 2023-01-24 DIAGNOSIS — M25661 Stiffness of right knee, not elsewhere classified: Secondary | ICD-10-CM | POA: Diagnosis not present

## 2023-01-24 DIAGNOSIS — M6281 Muscle weakness (generalized): Secondary | ICD-10-CM | POA: Diagnosis not present

## 2023-02-01 DIAGNOSIS — M25561 Pain in right knee: Secondary | ICD-10-CM | POA: Diagnosis not present

## 2023-02-01 DIAGNOSIS — M25661 Stiffness of right knee, not elsewhere classified: Secondary | ICD-10-CM | POA: Diagnosis not present

## 2023-02-01 DIAGNOSIS — M6281 Muscle weakness (generalized): Secondary | ICD-10-CM | POA: Diagnosis not present

## 2023-02-06 DIAGNOSIS — M25561 Pain in right knee: Secondary | ICD-10-CM | POA: Diagnosis not present

## 2023-02-06 DIAGNOSIS — M6281 Muscle weakness (generalized): Secondary | ICD-10-CM | POA: Diagnosis not present

## 2023-02-06 DIAGNOSIS — M25661 Stiffness of right knee, not elsewhere classified: Secondary | ICD-10-CM | POA: Diagnosis not present

## 2023-02-15 DIAGNOSIS — M25561 Pain in right knee: Secondary | ICD-10-CM | POA: Diagnosis not present

## 2023-02-15 DIAGNOSIS — M25661 Stiffness of right knee, not elsewhere classified: Secondary | ICD-10-CM | POA: Diagnosis not present

## 2023-02-15 DIAGNOSIS — Z1231 Encounter for screening mammogram for malignant neoplasm of breast: Secondary | ICD-10-CM | POA: Diagnosis not present

## 2023-02-15 DIAGNOSIS — M6281 Muscle weakness (generalized): Secondary | ICD-10-CM | POA: Diagnosis not present

## 2023-02-15 LAB — HM MAMMOGRAPHY

## 2023-02-22 DIAGNOSIS — M25661 Stiffness of right knee, not elsewhere classified: Secondary | ICD-10-CM | POA: Diagnosis not present

## 2023-02-22 DIAGNOSIS — M6281 Muscle weakness (generalized): Secondary | ICD-10-CM | POA: Diagnosis not present

## 2023-02-22 DIAGNOSIS — M25561 Pain in right knee: Secondary | ICD-10-CM | POA: Diagnosis not present

## 2023-03-01 DIAGNOSIS — M25661 Stiffness of right knee, not elsewhere classified: Secondary | ICD-10-CM | POA: Diagnosis not present

## 2023-03-01 DIAGNOSIS — M6281 Muscle weakness (generalized): Secondary | ICD-10-CM | POA: Diagnosis not present

## 2023-03-01 DIAGNOSIS — M25561 Pain in right knee: Secondary | ICD-10-CM | POA: Diagnosis not present

## 2023-04-24 DIAGNOSIS — Z780 Asymptomatic menopausal state: Secondary | ICD-10-CM | POA: Diagnosis not present

## 2023-04-24 DIAGNOSIS — N950 Postmenopausal bleeding: Secondary | ICD-10-CM | POA: Diagnosis not present

## 2023-04-27 DIAGNOSIS — M25561 Pain in right knee: Secondary | ICD-10-CM | POA: Diagnosis not present

## 2023-05-10 DIAGNOSIS — M25561 Pain in right knee: Secondary | ICD-10-CM | POA: Diagnosis not present

## 2023-05-15 DIAGNOSIS — Z01419 Encounter for gynecological examination (general) (routine) without abnormal findings: Secondary | ICD-10-CM | POA: Diagnosis not present

## 2023-05-15 DIAGNOSIS — Z6834 Body mass index (BMI) 34.0-34.9, adult: Secondary | ICD-10-CM | POA: Diagnosis not present

## 2023-05-30 DIAGNOSIS — M1711 Unilateral primary osteoarthritis, right knee: Secondary | ICD-10-CM | POA: Diagnosis not present

## 2023-10-04 DIAGNOSIS — R102 Pelvic and perineal pain: Secondary | ICD-10-CM | POA: Diagnosis not present

## 2023-10-04 DIAGNOSIS — R8290 Unspecified abnormal findings in urine: Secondary | ICD-10-CM | POA: Diagnosis not present

## 2023-10-04 DIAGNOSIS — N390 Urinary tract infection, site not specified: Secondary | ICD-10-CM | POA: Diagnosis not present

## 2023-10-04 DIAGNOSIS — R109 Unspecified abdominal pain: Secondary | ICD-10-CM | POA: Diagnosis not present

## 2023-10-16 DIAGNOSIS — R102 Pelvic and perineal pain: Secondary | ICD-10-CM | POA: Diagnosis not present

## 2023-11-27 DIAGNOSIS — D485 Neoplasm of uncertain behavior of skin: Secondary | ICD-10-CM | POA: Diagnosis not present

## 2023-11-27 DIAGNOSIS — L308 Other specified dermatitis: Secondary | ICD-10-CM | POA: Diagnosis not present

## 2023-11-27 DIAGNOSIS — L538 Other specified erythematous conditions: Secondary | ICD-10-CM | POA: Diagnosis not present

## 2023-11-27 DIAGNOSIS — L2989 Other pruritus: Secondary | ICD-10-CM | POA: Diagnosis not present

## 2023-11-27 DIAGNOSIS — L408 Other psoriasis: Secondary | ICD-10-CM | POA: Diagnosis not present

## 2023-11-27 DIAGNOSIS — L309 Dermatitis, unspecified: Secondary | ICD-10-CM | POA: Diagnosis not present

## 2024-01-15 DIAGNOSIS — Z872 Personal history of diseases of the skin and subcutaneous tissue: Secondary | ICD-10-CM | POA: Diagnosis not present

## 2024-01-15 DIAGNOSIS — L814 Other melanin hyperpigmentation: Secondary | ICD-10-CM | POA: Diagnosis not present

## 2024-01-15 DIAGNOSIS — L821 Other seborrheic keratosis: Secondary | ICD-10-CM | POA: Diagnosis not present

## 2024-01-15 DIAGNOSIS — L918 Other hypertrophic disorders of the skin: Secondary | ICD-10-CM | POA: Diagnosis not present

## 2024-01-15 DIAGNOSIS — L2089 Other atopic dermatitis: Secondary | ICD-10-CM | POA: Diagnosis not present

## 2024-01-15 DIAGNOSIS — D225 Melanocytic nevi of trunk: Secondary | ICD-10-CM | POA: Diagnosis not present

## 2024-04-05 DIAGNOSIS — M2241 Chondromalacia patellae, right knee: Secondary | ICD-10-CM | POA: Diagnosis not present

## 2024-05-06 DIAGNOSIS — N926 Irregular menstruation, unspecified: Secondary | ICD-10-CM | POA: Diagnosis not present

## 2024-05-06 DIAGNOSIS — N912 Amenorrhea, unspecified: Secondary | ICD-10-CM | POA: Diagnosis not present

## 2024-05-06 DIAGNOSIS — N939 Abnormal uterine and vaginal bleeding, unspecified: Secondary | ICD-10-CM | POA: Diagnosis not present

## 2024-05-20 ENCOUNTER — Encounter (HOSPITAL_BASED_OUTPATIENT_CLINIC_OR_DEPARTMENT_OTHER): Payer: Self-pay | Admitting: Emergency Medicine

## 2024-05-20 ENCOUNTER — Ambulatory Visit: Payer: Self-pay

## 2024-05-20 ENCOUNTER — Other Ambulatory Visit: Payer: Self-pay

## 2024-05-20 ENCOUNTER — Emergency Department (HOSPITAL_BASED_OUTPATIENT_CLINIC_OR_DEPARTMENT_OTHER)
Admission: EM | Admit: 2024-05-20 | Discharge: 2024-05-20 | Disposition: A | Discharge status: Home / Self Care | Attending: Emergency Medicine | Admitting: Emergency Medicine

## 2024-05-20 DIAGNOSIS — E871 Hypo-osmolality and hyponatremia: Secondary | ICD-10-CM | POA: Diagnosis not present

## 2024-05-20 DIAGNOSIS — E1165 Type 2 diabetes mellitus with hyperglycemia: Secondary | ICD-10-CM | POA: Insufficient documentation

## 2024-05-20 DIAGNOSIS — Z79899 Other long term (current) drug therapy: Secondary | ICD-10-CM | POA: Diagnosis not present

## 2024-05-20 DIAGNOSIS — Z85828 Personal history of other malignant neoplasm of skin: Secondary | ICD-10-CM | POA: Insufficient documentation

## 2024-05-20 DIAGNOSIS — I10 Essential (primary) hypertension: Secondary | ICD-10-CM | POA: Insufficient documentation

## 2024-05-20 DIAGNOSIS — Z01419 Encounter for gynecological examination (general) (routine) without abnormal findings: Secondary | ICD-10-CM | POA: Diagnosis not present

## 2024-05-20 DIAGNOSIS — Z1151 Encounter for screening for human papillomavirus (HPV): Secondary | ICD-10-CM | POA: Diagnosis not present

## 2024-05-20 DIAGNOSIS — R739 Hyperglycemia, unspecified: Secondary | ICD-10-CM | POA: Diagnosis not present

## 2024-05-20 DIAGNOSIS — Z124 Encounter for screening for malignant neoplasm of cervix: Secondary | ICD-10-CM | POA: Diagnosis not present

## 2024-05-20 DIAGNOSIS — Z6827 Body mass index (BMI) 27.0-27.9, adult: Secondary | ICD-10-CM | POA: Diagnosis not present

## 2024-05-20 DIAGNOSIS — R81 Glycosuria: Secondary | ICD-10-CM | POA: Diagnosis not present

## 2024-05-20 LAB — URINALYSIS, ROUTINE W REFLEX MICROSCOPIC
Bilirubin Urine: NEGATIVE
Glucose, UA: >=500 mg/dL — AB
Hgb urine dipstick: NEGATIVE
Ketones, ur: 20 mg/dL — AB
Leukocytes,Ua: NEGATIVE
Nitrite: NEGATIVE
Protein, ur: NEGATIVE mg/dL
Specific Gravity, Urine: 1.031 — ABNORMAL HIGH (ref 1.005–1.030)
pH: 6 (ref 5.0–8.0)

## 2024-05-20 LAB — BASIC METABOLIC PANEL WITH GFR
Anion gap: 13 (ref 5–15)
BUN: 10 mg/dL (ref 6–20)
CO2: 21 mmol/L — ABNORMAL LOW (ref 22–32)
Calcium: 9.4 mg/dL (ref 8.9–10.3)
Chloride: 94 mmol/L — ABNORMAL LOW (ref 98–111)
Creatinine, Ser: 0.67 mg/dL (ref 0.44–1.00)
GFR, Estimated: >60 mL/min (ref >60)
Glucose, Bld: 404 mg/dL — ABNORMAL HIGH (ref 70–99)
Potassium: 4 mmol/L (ref 3.5–5.1)
Sodium: 128 mmol/L — ABNORMAL LOW (ref 135–145)

## 2024-05-20 LAB — CBC
HCT: 40 % (ref 36.0–46.0)
Hemoglobin: 13.9 g/dL (ref 12.0–15.0)
MCH: 28.5 pg (ref 26.0–34.0)
MCHC: 34.8 g/dL (ref 30.0–36.0)
MCV: 82.1 fL (ref 80.0–100.0)
Platelets: 278 K/uL (ref 150–400)
RBC: 4.87 MIL/uL (ref 3.87–5.11)
RDW: 12.6 % (ref 11.5–15.5)
WBC: 9.8 K/uL (ref 4.0–10.5)
nRBC: 0 % (ref 0.0–0.2)

## 2024-05-20 LAB — CBG MONITORING, ED: Glucose-Capillary: 388 mg/dL — ABNORMAL HIGH (ref 70–99)

## 2024-05-20 MED ORDER — METFORMIN HCL 500 MG PO TABS
500.0000 mg | ORAL_TABLET | Freq: Two times a day (BID) | ORAL | 0 refills | Status: DC
  Filled 2024-05-20: qty 60, 30d supply, fill #0

## 2024-05-20 NOTE — ED Triage Notes (Addendum)
 Sent for CBG in 500's- at a routine visit 1100. No HX diabetes. Denies CP, SOB, N/V/D, no urinary symptoms. Endorses fatigue past several days and polydipsia.

## 2024-05-20 NOTE — ED Notes (Signed)
CBG 388 

## 2024-05-20 NOTE — Telephone Encounter (Signed)
 Arland from [facility] called while patient was at their office--Patient currently with Arland & blood sugar level was 582 there. ER disposition is refused so Donna/patient warm transferred over to the CAL for further assistance.     FYI Only or Action Required?: Action required by provider: update on patient condition and Refusal of ER at this time.  Patient was last seen in primary care on 01/10/2022 with Alyssa Allwardt PA-C.  Called Nurse Triage reporting Hyperglycemia.  Symptoms began symptoms a month ago but blood sugar reading checked today.  Interventions attempted: Nothing.  Symptoms are: unchanged.  Triage Disposition: Go to ED Now (or PCP Triage)  Patient/caregiver understands and will follow disposition?: No, wishes to speak with PCP             Copied from CRM #[id number]. Topic: Clinical - Red Word Triage >> May 20, 2024 12:51 PM Chasity T wrote: [facility] that prompted transfer to Nurse Triage: Arland from [facility] is calling in because patient is currently at the clinic and her sugar is at 582. Doctor wants her to be seen today at clinic Reason for Disposition  Blood glucose > 500 mg/dL (72.1 mmol/L)  Answer Assessment - Initial Assessment Questions Arland from [facility] [place] called on behalf of patient.  Patient is currently there at their facility and her blood sugar level was 582 when they checked it. Patient has no previous diagnosis of diabetes and does endorse that over the past month she has had increased thirst, increased amount of urine when urinating, and less energy. This RN also spoke with the patient to inquire about symptoms. They are both advised that with a blood sugar level over 500 it is recommended that the patient goes to the Emergency Room. Arland and patient are warm transferred to the CAL after refusing the Emergency Room at this time for further assistance.     1. BLOOD GLUCOSE:  What is your blood glucose level?      582 at doctors office today 2. ONSET: When did you check the blood glucose?     Today at doctors office 3. USUAL RANGE: What is your glucose level usually? (e.g., usual fasting morning value, usual evening value)     ------ 4. KETONES: Do you check for ketones (urine or blood test strips)? If Yes, ask: What does the test show now?      ------ 5. TYPE 1 or 2:  Do you know what type of diabetes you have?  (e.g., Type 1, Type 2, Gestational; doesn't know)      Never diagnosed 6. INSULIN: Do you take insulin? What type of insulin(s) do you use? What is the mode of delivery? (syringe, pen; injection or pump)?      N/A 7. DIABETES PILLS: Do you take any pills for your diabetes? If Yes, ask: Have you missed taking any pills recently?     No 8. OTHER SYMPTOMS: Do you have any symptoms? (e.g., fever, frequent urination, difficulty breathing, dizziness, weakness, vomiting)     Increased thirst, low energy, possibly an increase in amount of urine in the last month 9. PREGNANCY: Is there any chance you are pregnant? When was your last menstrual period?     No  Protocols used: Diabetes - High Blood Sugar-A-AH

## 2024-05-20 NOTE — ED Provider Notes (Signed)
 [place] [facility] AT [facility] Provider Note  CSN: [id number] Arrival date & time: 05/20/24 1433  Chief Complaint(s) Hyperglycemia  HPI Anna [name] is a 53 y.o. female here today after she was at a routine OB/GYN appointment, they noticed sugar in her urine, checked a CBG and it was greater than 500.  Patient does endorse polydipsia and polyuria over the last month.  She has not followed up with her regular PCP in 2 and half years.  Mother has diabetes.   Past Medical History Past Medical History:  Diagnosis Date   Allergy    Endometriosis    Diagnosed at age 41. Followed by Gynecology -- Dr. Leva   Gallstones    GERD (gastroesophageal reflux disease)    Heart murmur    Hypertension    Iron deficiency anemia    on OTC supplement   Squamous cell carcinoma of left lower leg    Patient Active Problem List   Diagnosis Date Noted   Hepatic adenoma 07/22/2020   NAFLD (nonalcoholic fatty liver disease) 89/92/7978   Hypertension 05/14/2020   Liver mass, right lobe 10/28/2018   Class 1 obesity due to excess calories with serious comorbidity and body mass index (BMI) of 34.0 to 34.9 in adult 04/16/2017   Absolute anemia 04/16/2017   Plantar fasciitis 04/16/2017   Home Medication(s) Prior to Admission medications   Medication Sig Start Date End Date Taking? Authorizing Provider  metFORMIN  (GLUCOPHAGE ) 500 MG tablet Take 1 tablet (500 mg total) by mouth 2 (two) times daily with a meal. 05/20/24  Yes Mannie Pac T, DO  amLODipine (NORVASC) 2.5 MG tablet Take 2.5 mg by mouth daily. 08/15/20   [provider]  carvedilol (COREG) 6.25 MG tablet Take by mouth. 05/17/20   [provider]  cholecalciferol (VITAMIN D3) 25 MCG (1000 UNIT) tablet Take 1,000 Units by mouth daily.    [provider]  ELDERBERRY PO Take by mouth.    [provider]  ferrous sulfate  325 (65 FE) MG tablet Take 1 tablet (325 mg total) by mouth 2 (two)  times daily with a meal. 05/20/20   Gladis Elsie BROCKS, PA-C  losartan  (COZAAR ) 25 MG tablet TAKE 1 TABLET BY MOUTH IN THE MORNING AND AT BEDTIME 05/23/22   Allwardt, Alyssa M, PA-C  meloxicam  (MOBIC ) 15 MG tablet Take 1 tablet by mouth once daily 08/07/22   Allwardt, Alyssa M, PA-C                                                                                                                                    Past Surgical History Past Surgical History:  Procedure Laterality Date   ABLATION ON ENDOMETRIOSIS  1997   COLONOSCOPY  03/2018   deviated setum  1986   ESOPHAGOGASTRODUODENOSCOPY  03/2018   MYOMECTOMY     Family History Family History  Problem Relation Age of Onset   Hypertension  Mother    Diabetes Mother    Heart attack Father 62   Lung disease Father    Hypertension Father    Hepatitis C Maternal Uncle    Lung disease Paternal Uncle    Emphysema Maternal Grandmother    Food Allergy Daughter    Asthma Daughter    Colon cancer Neg Hx    Esophageal cancer Neg Hx    Liver cancer Neg Hx    Pancreatic cancer Neg Hx    Stomach cancer Neg Hx    Rectal cancer Neg Hx     Social History Social History   Tobacco Use   Smoking status: Never   Smokeless tobacco: Never  Vaping Use   Vaping status: Never Used  Substance Use Topics   Alcohol use: Yes   Drug use: No   Allergies Erythromycin, Nitrofurantoin, and Red dye #40 (allura red)  Review of Systems Review of Systems  Physical Exam Vital Signs  I have reviewed the triage vital signs BP 136/75 (BP Location: Right Arm)   Pulse 86   Temp 98.2 F (36.8 C) (Oral)   Resp 18   SpO2 98%   Physical Exam Vitals and nursing note reviewed.  Cardiovascular:     Rate and Rhythm: Normal rate.  Abdominal:     General: Abdomen is flat.     Palpations: Abdomen is soft.  Musculoskeletal:        General: Normal range of motion.     Cervical back: Normal range of motion.  Neurological:     General: No focal deficit  present.     ED Results and Treatments Labs (all labs ordered are listed, but only abnormal results are displayed) Labs Reviewed  BASIC METABOLIC PANEL WITH GFR - Abnormal; Notable for the following components:      Result Value   Sodium 128 (*)    Chloride 94 (*)    CO2 21 (*)    Glucose, Bld 404 (*)    All other components within normal limits  CBG MONITORING, ED - Abnormal; Notable for the following components:   Glucose-Capillary 388 (*)    All other components within normal limits  CBC  URINALYSIS, ROUTINE W REFLEX MICROSCOPIC  HEMOGLOBIN A1C  CBG MONITORING, ED  CBG MONITORING, ED                                                                                                                          Radiology No results found.  Pertinent labs & imaging results that were available during my care of the patient were reviewed by me and considered in my medical decision making (see MDM for details).  Medications Ordered in ED Medications - No data to display  Procedures Procedures  (including critical care time)  Medical Decision Making / ED Course   This patient presents to the ED for concern of elevated glucose at outpatient visit, this involves an extensive number of treatment options, and is a complaint that carries with it a high risk of complications and morbidity.  The differential diagnosis includes diabetes, less likely acidosis, less likely HHS, less likely underlying infection.  MDM: Patient well-appearing on exam.  She has a good story for developing diabetes.  Patient tells me that she drinks 2 Coca-Cola's per day.  Counseled patient on importance of diet and exercise.  Patient without any tachypnea, overall well-appearing.  She is not having abdominal pain, no vomiting.  Patient does endorse intentional weight loss over the last 1  year.  Patient's repeat sugar here 388.  Will check her basic panel.  Anticipate discharging patient with metformin , PCP follow-up.  Reassessment 7 PM-no anion gap.  Patient's urine sent to [facility] for unknown reasons.  A1c still in process.  Will discharge patient with prescription for metformin .   Additional history obtained:  -External records from outside source obtained and reviewed including: Chart review including previous notes, labs, imaging, consultation notes   Lab Tests: -I ordered, reviewed, and interpreted labs.   The pertinent results include:   Labs Reviewed  BASIC METABOLIC PANEL WITH GFR - Abnormal; Notable for the following components:      Result Value   Sodium 128 (*)    Chloride 94 (*)    CO2 21 (*)    Glucose, Bld 404 (*)    All other components within normal limits  CBG MONITORING, ED - Abnormal; Notable for the following components:   Glucose-Capillary 388 (*)    All other components within normal limits  CBC  URINALYSIS, ROUTINE W REFLEX MICROSCOPIC  HEMOGLOBIN A1C  CBG MONITORING, ED  CBG MONITORING, ED      Medicines ordered and prescription drug management: Meds ordered this encounter  Medications   metFORMIN  (GLUCOPHAGE ) 500 MG tablet    Sig: Take 1 tablet (500 mg total) by mouth 2 (two) times daily with a meal.    Dispense:  60 tablet    Refill:  0    -I have reviewed the patients home medicines and have made adjustments as needed   Cardiac Monitoring: The patient was maintained on a cardiac monitor.  I personally viewed and interpreted the cardiac monitored which showed an underlying rhythm of: Normal sinus rhythm  Social Determinants of Health:  Factors impacting patients care include: Lack of access to primary care   Reevaluation: After the interventions noted above, I reevaluated the patient and found that they have :improved  Co morbidities that complicate the patient evaluation  Past Medical History:  Diagnosis Date    Allergy    Endometriosis    Diagnosed at age 89. Followed by Gynecology -- Dr. Leva   Gallstones    GERD (gastroesophageal reflux disease)    Heart murmur    Hypertension    Iron deficiency anemia    on OTC supplement   Squamous cell carcinoma of left lower leg       Dispostion: I considered admission for this patient, however she is appropriate for outpatient diabetes management     Final Clinical Impression(s) / ED Diagnoses Final diagnoses:  Hyperglycemia due to diabetes mellitus (HCC)     @PCDICTATION @    Mannie Pac T, DO 05/20/24 1904

## 2024-05-20 NOTE — Discharge Instructions (Addendum)
 You have high blood sugar being caused by diabetes.  I have sent a prescription for metformin .  You can take 500 mg once in the morning and once in the evening for the next month.  Please call your primary care doctor tomorrow to set up a follow-up appointment.

## 2024-05-20 NOTE — Telephone Encounter (Signed)
 Ronal, triage nurse, called back into office stating that despite informing patient that PCP is not in office and that she would be sent to the ED either way, as advised by me, patient declined to go to ED. Ronal was able to get me transferred to Arland who then helped me speak directly to patient. I explained to pt that based off of sx, she would need to go to the ED since they are better equipped to handle this particular situation. Pt verbalized understanding and stated she will be going to the ED.

## 2024-05-20 NOTE — Telephone Encounter (Signed)
 Please see triage note, confirmed pt is at ED seeking treatment at this time.

## 2024-05-21 ENCOUNTER — Other Ambulatory Visit (HOSPITAL_BASED_OUTPATIENT_CLINIC_OR_DEPARTMENT_OTHER): Payer: Self-pay

## 2024-05-22 ENCOUNTER — Telehealth: Payer: Self-pay

## 2024-05-22 NOTE — Telephone Encounter (Signed)
 Transition Care Management Unsuccessful Follow-up Telephone Call  Date of discharge and from where:  05/20/2024- [facility]  Attempts:  1st Attempt  Reason for unsuccessful TCM follow-up call:  Call answered but on the way to another appt, advised cb number and pt advised would return my call this afternoon.

## 2024-05-22 NOTE — Telephone Encounter (Signed)
 Transition Care Management Follow-up Telephone Call Date of discharge and from where: 05/20/24; [facility] How have you been since you were released from the hospital? Better Any questions or concerns? No  Items Reviewed: Did the pt receive and understand the discharge instructions provided? Yes  Medications obtained and verified? Yes  Other?   Any new allergies since your discharge? No  Dietary orders reviewed? No Do you have support at home? Yes   Home Care and Equipment/Supplies: Were home health services ordered? no If so, what is the name of the agency?   Has the agency set up a time to come to the patient's home? not applicable Were any new equipment or medical supplies ordered?  No What is the name of the medical supply agency?  Were you able to get the supplies/equipment? not applicable Do you have any questions related to the use of the equipment or supplies? No  Functional Questionnaire: (I = Independent and D = Dependent) ADLs: I  Bathing/Dressing- I  Meal Prep- I  Eating- I  Maintaining continence- I  Transferring/Ambulation- I  Managing Meds- I  Follow up appointments reviewed:  PCP Hospital f/u appt confirmed? Yes  Scheduled to see Alyssa Allwardt on 06/02/2024 @ 10:00am. Specialist Hospital f/u appt confirmed? N/a Are transportation arrangements needed? No  If their condition worsens, is the pt aware to call PCP or go to the Emergency Dept.? Yes Was the patient provided with contact information for the PCP's office or ED? Yes Was to pt encouraged to call back with questions or concerns? Yes

## 2024-05-23 LAB — HM PAP SMEAR: HPV, high-risk: NEGATIVE

## 2024-05-29 DIAGNOSIS — N950 Postmenopausal bleeding: Secondary | ICD-10-CM | POA: Diagnosis not present

## 2024-05-29 DIAGNOSIS — D250 Submucous leiomyoma of uterus: Secondary | ICD-10-CM | POA: Diagnosis not present

## 2024-05-29 DIAGNOSIS — D251 Intramural leiomyoma of uterus: Secondary | ICD-10-CM | POA: Diagnosis not present

## 2024-06-02 ENCOUNTER — Ambulatory Visit: Admitting: Physician Assistant

## 2024-06-02 ENCOUNTER — Encounter: Payer: Self-pay | Admitting: Physician Assistant

## 2024-06-02 ENCOUNTER — Other Ambulatory Visit: Payer: Self-pay | Admitting: Emergency Medicine

## 2024-06-02 VITALS — BP 132/88 | HR 85 | Temp 97.9°F | Ht 63.0 in | Wt 155.8 lb

## 2024-06-02 DIAGNOSIS — I10 Essential (primary) hypertension: Secondary | ICD-10-CM

## 2024-06-02 DIAGNOSIS — Z13220 Encounter for screening for lipoid disorders: Secondary | ICD-10-CM

## 2024-06-02 DIAGNOSIS — E1165 Type 2 diabetes mellitus with hyperglycemia: Secondary | ICD-10-CM

## 2024-06-02 DIAGNOSIS — K760 Fatty (change of) liver, not elsewhere classified: Secondary | ICD-10-CM | POA: Diagnosis not present

## 2024-06-02 DIAGNOSIS — Z7984 Long term (current) use of oral hypoglycemic drugs: Secondary | ICD-10-CM

## 2024-06-02 DIAGNOSIS — R634 Abnormal weight loss: Secondary | ICD-10-CM

## 2024-06-02 DIAGNOSIS — D134 Benign neoplasm of liver: Secondary | ICD-10-CM

## 2024-06-02 LAB — COMPREHENSIVE METABOLIC PANEL WITH GFR
ALT: 22 U/L (ref 0–35)
AST: 14 U/L (ref 0–37)
Albumin: 4 g/dL (ref 3.5–5.2)
Alkaline Phosphatase: 76 U/L (ref 39–117)
BUN: 10 mg/dL (ref 6–23)
CO2: 27 meq/L (ref 19–32)
Calcium: 8.9 mg/dL (ref 8.4–10.5)
Chloride: 97 meq/L (ref 96–112)
Creatinine, Ser: 0.51 mg/dL (ref 0.40–1.20)
GFR: 105.72 mL/min (ref >60.00)
Glucose, Bld: 311 mg/dL — ABNORMAL HIGH (ref 70–99)
Potassium: 3.8 meq/L (ref 3.5–5.1)
Sodium: 132 meq/L — ABNORMAL LOW (ref 135–145)
Total Bilirubin: 1.4 mg/dL — ABNORMAL HIGH (ref 0.2–1.2)
Total Protein: 6.4 g/dL (ref 6.0–8.3)

## 2024-06-02 LAB — POCT GLYCOSYLATED HEMOGLOBIN (HGB A1C): Hemoglobin A1C: 14.7 % — AB (ref 4.0–5.6)

## 2024-06-02 LAB — LIPID PANEL
Cholesterol: 191 mg/dL (ref 0–200)
HDL: 61.5 mg/dL (ref >39.00)
LDL Cholesterol: 103 mg/dL — ABNORMAL HIGH (ref 0–99)
NonHDL: 129.41
Total CHOL/HDL Ratio: 3
Triglycerides: 134 mg/dL (ref 0.0–149.0)
VLDL: 26.8 mg/dL (ref 0.0–40.0)

## 2024-06-02 LAB — MICROALBUMIN / CREATININE URINE RATIO
Creatinine,U: 29.9 mg/dL
Microalb Creat Ratio: UNDETERMINED mg/g (ref 0.0–30.0)
Microalb, Ur: 0.7 mg/dL (ref 0.0–1.9)

## 2024-06-02 LAB — TSH: TSH: 2.55 u[IU]/mL (ref 0.35–5.50)

## 2024-06-02 NOTE — Telephone Encounter (Unsigned)
 Copied from CRM #[id number]. Topic: Clinical - Medication Refill >> Jun 02, 2024  4:00 PM Roselie C wrote: Medication: metFORMIN  (GLUCOPHAGE ) 500 MG tablet twice a day  Has the patient contacted their pharmacy? Yes (Agent: If no, request that the patient contact the pharmacy for the refill. If patient does not wish to contact the pharmacy document the reason why and proceed with request.) (Agent: If yes, when and what did the pharmacy advise?)  This is the patient's preferred pharmacy:  [facility] [address], [place] - [address] [place] [address] Phone: [phone] Fax: [phone]   Is this the correct pharmacy for this prescription? Yes If no, delete pharmacy and type the correct one.   Has the prescription been filled recently? Yes  Is the patient out of the medication? No  Has the patient been seen for an appointment in the last year OR does the patient have an upcoming appointment? Yes  Can we respond through MyChart? Yes  Agent: Please be advised that Rx refills may take up to 3 business days. We ask that you follow-up with your pharmacy.

## 2024-06-02 NOTE — Progress Notes (Signed)
 Patient ID: Anna [name], female    DOB: 1971/03/08, 53 y.o.   MRN: [id number]   Assessment & Plan:  Type 2 diabetes mellitus with hyperglycemia, without long-term current use of insulin  (HCC) -     Microalbumin / creatinine urine ratio -     POCT glycosylated hemoglobin (Hb A1C) -     Comprehensive metabolic panel with GFR -     C-peptide -     Insulin , random  Long term current use of oral hypoglycemic drug  Primary hypertension  NAFLD (nonalcoholic fatty liver disease) -     Comprehensive metabolic panel with GFR  Hepatic adenoma  Screening for cholesterol level -     Lipid panel  Abnormal weight loss -     Comprehensive metabolic panel with GFR -     TSH      Assessment and Plan Assessment & Plan Type 2 diabetes mellitus with hyperglycemia Type 2 diabetes mellitus with significant hyperglycemia. Recent glucose level of 404 mg/dL without anion gap acidosis, ruling out DKA. Current A1c is 14.7%, indicating poor glycemic control. Symptoms include blurred vision, polyuria, and polydipsia, improved with metformin . Significant weight loss likely due to hyperglycemia. She is motivated to improve condition and has reduced intake of sugary beverages. Discussed Freestyle Libre for glucose monitoring, noting insurance limitations for non-insulin  users. Provided dietary guidance to avoid excessive carbohydrate restriction. - Continue metformin  500 mg twice daily - Provide sample of Freestyle Libre for glucose monitoring - Order C-peptide and insulin  levels to confirm type 2 diabetes - Provide dietary handouts focusing on low-carb options - Schedule follow-up in 3 months for A1c check - Order fasting labs including liver function tests, cholesterol, and thyroid  function tests  Essential hypertension Essential hypertension managed with amlodipine, losartan , and carvedilol. Blood pressure management is ongoing with current medication regimen. - Continue current antihypertensive  regimen with amlodipine, losartan , and carvedilol - Follows with cardiology   NAFLD, hepatic adenomas -Needs to f/up with specialist -check liver function today       Return in about 3 months (around 09/02/2024) for recheck/follow-up.    Subjective:    Chief Complaint  Patient presents with   Hospitalization Follow-up    Pt in office for Hospital follow up; pt seen in Ed on 05/20/24 for hyperglycemia; pt states new dx on diabetes; now putting symptoms together sees this has been happening for sometimes; excessive thirst, weight loss, and vision changes. Pt needing help getting scheduled for diabetic eye exam.     HPI Discussed the use of AI scribe software for clinical note transcription with the patient, who gave verbal consent to proceed.  History of Present Illness Anna [name] is a 53 year old female with diabetes who presents for follow-up after an emergency department visit for hyperglycemia.  She had an emergency department visit on May 20, 2024, due to hyperglycemia with a blood glucose level of 404 mg/dL and glucosuria. She was not in diabetic ketoacidosis as her anion gap was normal. Her last visit was in 2023, where her A1c was 6.7%.  She has experienced significant weight loss from 195 pounds in March 2023 to 155 pounds currently. She reports intentional weight loss efforts and also notes significant weight loss over the past year. She has increased thirst and frequent urination, which have improved since starting metformin . She also has blurred vision requiring reading glasses.  Her current medications include metformin  500 mg twice daily. She has a history of hypertension managed with amlodipine,  losartan , and carvedilol, taken twice daily. She also has a history of liver nodules attributed to long-term birth control use, which have since shrunk after discontinuation of the medication.  Family history is significant for diabetes, with her mother diagnosed at age 69  and currently well-controlled at age 45. Several maternal uncles and one cousin also have diabetes.  She consumes a high amount of Coca [employer]a, which she is attempting to reduce. She drinks over 100 ounces of water daily and is trying to improve her diet by reducing carbohydrate intake and increasing protein consumption.  No blood in stool, night sweats, or recent illness.     Past Medical History:  Diagnosis Date   Allergy    Endometriosis    Diagnosed at age 39. Followed by Gynecology -- Dr. 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     Past Surgical History:  Procedure Laterality Date   ABLATION ON ENDOMETRIOSIS  1997   COLONOSCOPY  03/2018   deviated setum  1986   ESOPHAGOGASTRODUODENOSCOPY  03/2018   MYOMECTOMY      Family History  Problem Relation Age of Onset   Hypertension Mother    Diabetes Mother 86   Heart attack Father 69   Lung disease Father    Hypertension Father    Emphysema Maternal Grandmother    Food Allergy Daughter    Asthma Daughter    Hepatitis C Maternal Uncle    Lung disease Paternal Uncle    Colon cancer Neg Hx    Esophageal cancer Neg Hx    Liver cancer Neg Hx    Pancreatic cancer Neg Hx    Stomach cancer Neg Hx    Rectal cancer Neg Hx     Social History   Tobacco Use   Smoking status: Never   Smokeless tobacco: Never  Vaping Use   Vaping status: Never Used  Substance Use Topics   Alcohol use: Yes   Drug use: No     Allergies  Allergen Reactions   Erythromycin Rash   Nitrofurantoin Rash and Other (See Comments)   Red Dye #40 (Allura Red) Rash    Review of Systems NEGATIVE UNLESS OTHERWISE INDICATED IN HPI      Objective:     BP 132/88 (BP Location: Left Arm, Patient Position: Sitting, Cuff Size: Normal)   Pulse 85   Temp 97.9 F (36.6 C)  (Temporal)   Ht 5' 3 (1.6 m)   Wt 155 lb 12.8 oz (70.7 kg)   SpO2 99%   BMI 27.60 kg/m   Wt Readings from Last 3 Encounters:  06/02/24 155 lb 12.8 oz (70.7 kg)  01/10/22 195 lb 6.1 oz (88.6 kg)  10/29/21 195 lb (88.5 kg)    BP Readings from Last 3 Encounters:  06/02/24 132/88  05/20/24 136/75  01/10/22 133/79     Physical Exam Vitals and nursing note reviewed.  Constitutional:      General: She is not in acute distress.    Appearance: Normal appearance. She is not ill-appearing.  HENT:     Head: Normocephalic.     Right Ear: External ear normal.     Left Ear: External ear normal.  Eyes:     Extraocular Movements: Extraocular movements intact.     Conjunctiva/sclera: Conjunctivae  normal.     Pupils: Pupils are equal, round, and reactive to light.  Cardiovascular:     Rate and Rhythm: Normal rate and regular rhythm.     Pulses: Normal pulses.     Heart sounds: Normal heart sounds. No murmur heard. Pulmonary:     Effort: Pulmonary effort is normal. No respiratory distress.     Breath sounds: Normal breath sounds. No wheezing.  Musculoskeletal:     Cervical back: Normal range of motion.  Skin:    General: Skin is warm.  Neurological:     Mental Status: She is alert and oriented to person, place, and time.  Psychiatric:        Mood and Affect: Mood normal.        Behavior: Behavior normal.             Zanylah Hardie M Loriana Samad, PA-C

## 2024-06-03 LAB — INSULIN, RANDOM: Insulin: 5.4 u[IU]/mL

## 2024-06-03 LAB — C-PEPTIDE: C-Peptide: 1.39 ng/mL (ref 0.80–3.85)

## 2024-06-04 ENCOUNTER — Ambulatory Visit: Payer: Self-pay | Admitting: Physician Assistant

## 2024-06-05 DIAGNOSIS — S0502XA Injury of conjunctiva and corneal abrasion without foreign body, left eye, initial encounter: Secondary | ICD-10-CM | POA: Diagnosis not present

## 2024-06-06 DIAGNOSIS — H16102 Unspecified superficial keratitis, left eye: Secondary | ICD-10-CM | POA: Diagnosis not present

## 2024-06-06 DIAGNOSIS — H53149 Visual discomfort, unspecified: Secondary | ICD-10-CM | POA: Diagnosis not present

## 2024-06-09 ENCOUNTER — Other Ambulatory Visit: Payer: Self-pay

## 2024-06-09 ENCOUNTER — Telehealth: Payer: Self-pay

## 2024-06-09 ENCOUNTER — Other Ambulatory Visit: Payer: Self-pay | Admitting: Physician Assistant

## 2024-06-09 DIAGNOSIS — N950 Postmenopausal bleeding: Secondary | ICD-10-CM | POA: Insufficient documentation

## 2024-06-09 MED ORDER — LANCET DEVICE MISC: 1.0000 | Freq: Three times a day (TID) | 0 refills | Status: DC

## 2024-06-09 MED ORDER — BLOOD GLUCOSE MONITORING SUPPL DEVI: 1.0000 | Freq: Three times a day (TID) | 0 refills | Status: AC

## 2024-06-09 MED ORDER — BLOOD GLUCOSE TEST VI STRP
1.0000 | ORAL_STRIP | Freq: Three times a day (TID) | 0 refills | Status: DC
Start: 2024-06-09 — End: 2024-08-05

## 2024-06-09 NOTE — Telephone Encounter (Signed)
 Copied from CRM #[id number]. Topic: Clinical - Prescription Issue >> Jun 09, 2024  9:36 AM Donna BRAVO wrote: Reason for CRM: patient calling asking why metFORMIN  (GLUCOPHAGE ) 500 MG tablet has not been called in patient would would like a 60 day supply called in.  Patient would like the Onetouch kit: monitoring system, test strips, lances everything that goes with it.  Patient was given the Libra 3 sensor, and does not want to use it at this time.  Returned pt call and immediately got voicemail. Advised on msg, Metformin  not sent in to pharmacy since this was just sent in on 05/20/24 by another provider. Also, advised happy to send in Diabetic testing supplies to pharmacy for patient. Left office cb number if needing to return the call

## 2024-06-10 ENCOUNTER — Other Ambulatory Visit: Payer: Self-pay

## 2024-06-10 MED ORDER — METFORMIN HCL 500 MG PO TABS: 500.0000 mg | ORAL_TABLET | Freq: Two times a day (BID) | ORAL | 0 refills | Status: DC

## 2024-06-10 NOTE — Telephone Encounter (Signed)
Rx sent to pharmacy for patient.

## 2024-07-07 NOTE — H&P (Signed)
 Patient name   Anna [name], Anna [name] DICTATION# [id number] RDW#[id number]  Norleen Skill, MD 07/07/2024 7:16 AM

## 2024-07-09 NOTE — H&P (Signed)
 NAME: Mcmartin, Concha A. MEDICAL RECORD NO: [id number] ACCOUNT NO: [id number] DATE OF BIRTH: June 30, 1971 PHYSICIAN: Norleen CANDIE Skill, MD  History and Physical   DATE OF ADMISSION: 07/17/2024  The date of her surgery is 07/17/2024.  HISTORY OF PRESENT ILLNESS:  The patient is a 53 year old gravida 2, para 2 female who presents for hysteroscopic evaluation and potential resection of a submucosal fibroid.  She has had numerous FSHs that have been consistent with menopause.  She still  has a Mirena IUD in place.  Her last FSH was 58.7.  She had some postmenopausal bleeding.  We did a saline infusion ultrasound that revealed a 3.7 cm intracavitary fibroid.  Also, she had a small polypoid outgrowth measuring 9 mm.  She now presents for  hysteroscopic evaluation, attempt at resection of the intracavitary fibroid and polyp.  ALLERGIES:  IN TERMS OF ALLERGIES, SHE IS ALLERGIC TO ERYTHROMYCIN AND MACROBID.  MEDICATIONS:  Include amlodipine 2.5 mg tablets every 12 hours, carvedilol 12.5 mg daily, losartan  50 mg daily.  PAST MEDICAL HISTORY:  The patient does have an IUD in place.  She is being managed for hypertension on medications as noted.  She had her first cousin who had breast cancer before age 66.  Genetic testing came back negative.  She does have a history of gallstones, history of focal nodular hyperplasia of the liver and again is being managed for hypertension.  SOCIAL HISTORY:  Reveals no tobacco and minimal alcohol use.  FAMILY HISTORY:  Noncontributory.  REVIEW OF SYSTEMS:  Noncontributory.  PHYSICAL EXAMINATION: VITAL SIGNS:  The patient is afebrile with stable vital signs. HEENT:  The patient is normocephalic.  Pupils equal and reactive to light and accommodation.  Sclerae and conjunctivae are clear. NECK:  Without thyromegaly. BREASTS:  No skin or nipple changes.  No dominant mass, adenopathy, or nipple discharge. LUNGS:  Clear. CARDIOVASCULAR:  Regular rhythm and rate without  murmurs or gallops. ABDOMEN:  Benign.  No masses, organomegaly or tenderness. PELVIC:  She has normal external genitalia.  Vaginal mucosa is clear.  Cervix is unremarkable.  Uterus normal size, shape, and contour.  Adnexa free of masses or tenderness. NEUROLOGIC:  Grossly within normal limits.  IMPRESSION:  Therefore is 1.  Postmenopausal bleeding with evidence of an intrauterine fibroid and polyp. 2.  Hypertension.  PLAN:  The patient will undergo hysteroscopic evaluation and attempt at resection of the intrauterine fibroid and polyp.  Risks of procedure have been discussed including the risk of infection.  The risk of hemorrhage that could require transfusion with  the risk of AIDS or hepatitis.  Risk of perforation with injury to adjacent organs requiring exploratory surgery.  Risk of deep venous thrombosis and pulmonary embolus.  The patient expressed understanding of the indications and risks.   SHW D: 07/07/2024 7:15:12 am T: 07/07/2024 7:47:00 am  JOB: [id number]/ [id number]

## 2024-07-11 ENCOUNTER — Encounter (HOSPITAL_COMMUNITY): Payer: Self-pay | Admitting: Obstetrics and Gynecology

## 2024-07-11 NOTE — Progress Notes (Signed)
 Spoke w/ via phone for pre-op interview--- Anna [name] needs dos----  CBC, RPR, T&S, CMP, HIV and HIV save tube per surgeon. A1C CBG and EKG per anesthesia.       [name] results------ COVID test -----patient states asymptomatic no test needed Arrive at -------0530 NPO after MN NO Solid Food.  Clear liquids from MN until---0430 Pre-Surgery Ensure or G2:  Med rec completed Medications to take morning of surgery -----Norvasc and Coreg Diabetic medication -----None AM of surgery.  GLP1 agonist last dose: GLP1 instructions:  Patient instructed no nail polish to be worn day of surgery Patient instructed to bring photo id and insurance card day of surgery Patient aware to have Driver (ride ) / caregiver    for 24 hours after surgery - Husband Anna [name] Patient Special Instructions ----- Pre-Op special Instructions -----  Patient verbalized understanding of instructions that were given at this phone interview. Patient denies chest pain, sob, fever, cough at the interview.

## 2024-07-16 NOTE — Anesthesia Preprocedure Evaluation (Signed)
 Anesthesia Evaluation  Patient identified by MRN, date of birth, ID band Patient awake    Reviewed: Allergy & Precautions, H&P , NPO status , Patient's Chart, lab work & pertinent test results  Airway Mallampati: II  TM Distance: >3 FB Neck ROM: Full    Dental no notable dental hx. (+) Teeth Intact, Dental Advisory Given   Pulmonary neg pulmonary ROS   Pulmonary exam normal breath sounds clear to auscultation       Cardiovascular Exercise Tolerance: Good hypertension, Pt. on medications negative cardio ROS Normal cardiovascular exam+ Valvular Problems/Murmurs  Rhythm:Regular Rate:Normal     Neuro/Psych negative neurological ROS  negative psych ROS   GI/Hepatic negative GI ROS, Neg liver ROS,GERD  ,,  Endo/Other  negative endocrine ROSdiabetes, Oral Hypoglycemic Agents  Class 3 obesity  Renal/GU negative Renal ROS  negative genitourinary   Musculoskeletal negative musculoskeletal ROS (+)    Abdominal   Peds negative pediatric ROS (+)  Hematology negative hematology ROS (+) Blood dyscrasia, anemia   Anesthesia Other Findings   Reproductive/Obstetrics negative OB ROS                              Anesthesia Physical Anesthesia Plan  ASA: 3  Anesthesia Plan: General   Post-op Pain Management: Toradol IV (intra-op)* and Ofirmev IV (intra-op)*   Induction: Intravenous  PONV Risk Score and Plan: 3 and Ondansetron and Dexamethasone  Airway Management Planned: LMA  Additional Equipment: None  Intra-op Plan:   Post-operative Plan: Extubation in OR  Informed Consent: I have reviewed the patients History and Physical, chart, labs and discussed the procedure including the risks, benefits and alternatives for the proposed anesthesia with the patient or authorized representative who has indicated his/her understanding and acceptance.       Plan Discussed with: CRNA, Surgeon and  Anesthesiologist  Anesthesia Plan Comments: ( Elevated Glucose will treat and lectured patient about better control.  Recently started on Metformin .)         Anesthesia Quick Evaluation

## 2024-07-17 ENCOUNTER — Ambulatory Visit (HOSPITAL_COMMUNITY): Payer: Self-pay | Admitting: Anesthesiology

## 2024-07-17 ENCOUNTER — Encounter (HOSPITAL_COMMUNITY): Payer: Self-pay | Admitting: Obstetrics and Gynecology

## 2024-07-17 ENCOUNTER — Ambulatory Visit (HOSPITAL_BASED_OUTPATIENT_CLINIC_OR_DEPARTMENT_OTHER): Payer: Self-pay | Admitting: Anesthesiology

## 2024-07-17 ENCOUNTER — Other Ambulatory Visit: Payer: Self-pay

## 2024-07-17 ENCOUNTER — Ambulatory Visit (HOSPITAL_COMMUNITY)
Admission: RE | Admit: 2024-07-17 | Discharge: 2024-07-17 | Disposition: A | Discharge status: Home / Self Care | Attending: Obstetrics and Gynecology | Admitting: Obstetrics and Gynecology

## 2024-07-17 ENCOUNTER — Encounter (HOSPITAL_COMMUNITY): Admission: RE | Disposition: A | Payer: Self-pay | Source: Home / Self Care | Attending: Obstetrics and Gynecology

## 2024-07-17 DIAGNOSIS — Z01818 Encounter for other preprocedural examination: Secondary | ICD-10-CM

## 2024-07-17 DIAGNOSIS — E66813 Obesity, class 3: Secondary | ICD-10-CM

## 2024-07-17 DIAGNOSIS — E119 Type 2 diabetes mellitus without complications: Secondary | ICD-10-CM | POA: Insufficient documentation

## 2024-07-17 DIAGNOSIS — K219 Gastro-esophageal reflux disease without esophagitis: Secondary | ICD-10-CM | POA: Diagnosis not present

## 2024-07-17 DIAGNOSIS — I10 Essential (primary) hypertension: Secondary | ICD-10-CM

## 2024-07-17 DIAGNOSIS — N858 Other specified noninflammatory disorders of uterus: Secondary | ICD-10-CM | POA: Diagnosis not present

## 2024-07-17 DIAGNOSIS — D259 Leiomyoma of uterus, unspecified: Secondary | ICD-10-CM

## 2024-07-17 DIAGNOSIS — Z7984 Long term (current) use of oral hypoglycemic drugs: Secondary | ICD-10-CM | POA: Diagnosis not present

## 2024-07-17 DIAGNOSIS — Z6825 Body mass index (BMI) 25.0-25.9, adult: Secondary | ICD-10-CM

## 2024-07-17 DIAGNOSIS — D219 Benign neoplasm of connective and other soft tissue, unspecified: Secondary | ICD-10-CM | POA: Diagnosis not present

## 2024-07-17 DIAGNOSIS — E11621 Type 2 diabetes mellitus with foot ulcer: Secondary | ICD-10-CM

## 2024-07-17 HISTORY — DX: Type 2 diabetes mellitus without complications: E11.9

## 2024-07-17 HISTORY — PX: MYOSURE RESECTION: SHX7611

## 2024-07-17 HISTORY — PX: HYSTEROSCOPY WITH MYOMECTOMY: SHX7591

## 2024-07-17 LAB — COMPREHENSIVE METABOLIC PANEL WITH GFR
ALT: 36 U/L (ref 0–44)
AST: 23 U/L (ref 15–41)
Albumin: 3.9 g/dL (ref 3.5–5.0)
Alkaline Phosphatase: 89 U/L (ref 38–126)
Anion gap: 9 (ref 5–15)
BUN: 11 mg/dL (ref 6–20)
CO2: 28 mmol/L (ref 22–32)
Calcium: 9.5 mg/dL (ref 8.9–10.3)
Chloride: 97 mmol/L — ABNORMAL LOW (ref 98–111)
Creatinine, Ser: 0.51 mg/dL (ref 0.44–1.00)
GFR, Estimated: >60 mL/min (ref >60)
Glucose, Bld: 315 mg/dL — ABNORMAL HIGH (ref 70–99)
Potassium: 3.9 mmol/L (ref 3.5–5.1)
Sodium: 134 mmol/L — ABNORMAL LOW (ref 135–145)
Total Bilirubin: 1.5 mg/dL — ABNORMAL HIGH (ref 0.0–1.2)
Total Protein: 6.7 g/dL (ref 6.5–8.1)

## 2024-07-17 LAB — CBC
HCT: 43.3 % (ref 36.0–46.0)
Hemoglobin: 14.4 g/dL (ref 12.0–15.0)
MCH: 27.9 pg (ref 26.0–34.0)
MCHC: 33.3 g/dL (ref 30.0–36.0)
MCV: 83.8 fL (ref 80.0–100.0)
Platelets: 313 K/uL (ref 150–400)
RBC: 5.17 MIL/uL — ABNORMAL HIGH (ref 3.87–5.11)
RDW: 11.9 % (ref 11.5–15.5)
WBC: 8.7 K/uL (ref 4.0–10.5)
nRBC: 0 % (ref 0.0–0.2)

## 2024-07-17 LAB — ABO/RH: ABO/RH(D): O NEG

## 2024-07-17 LAB — GLUCOSE, CAPILLARY
Glucose-Capillary: 365 mg/dL — ABNORMAL HIGH (ref 70–99)
Glucose-Capillary: 298 mg/dL — ABNORMAL HIGH (ref 70–99)
Glucose-Capillary: 249 mg/dL — ABNORMAL HIGH (ref 70–99)

## 2024-07-17 LAB — HIV ANTIBODY (ROUTINE TESTING W REFLEX): HIV Screen 4th Generation wRfx: NONREACTIVE

## 2024-07-17 LAB — POCT PREGNANCY, URINE: Preg Test, Ur: NEGATIVE

## 2024-07-17 LAB — TYPE AND SCREEN
ABO/RH(D): O NEG
Antibody Screen: NEGATIVE

## 2024-07-17 SURGERY — HYSTEROSCOPY WITH MYOMECTOMY
Anesthesia: General | Site: Uterus

## 2024-07-17 MED ORDER — BUPIVACAINE HCL 0.25 % IJ SOLN
INTRAMUSCULAR | Status: DC | PRN
Start: 2024-07-17 — End: 2024-07-17
  Administered 2024-07-17: 10 mL

## 2024-07-17 MED ORDER — LIDOCAINE 2% (20 MG/ML) 5 ML SYRINGE
INTRAMUSCULAR | Status: DC | PRN
  Administered 2024-07-17: 100 mg via INTRAVENOUS

## 2024-07-17 MED ORDER — INSULIN ASPART 100 UNIT/ML IJ SOLN
INTRAMUSCULAR | Status: AC
  Filled 2024-07-17: qty 1

## 2024-07-17 MED ORDER — PROPOFOL 10 MG/ML IV BOLUS
INTRAVENOUS | Status: DC | PRN
  Administered 2024-07-17: 150 mg via INTRAVENOUS

## 2024-07-17 MED ORDER — LACTATED RINGERS IV SOLN: INTRAVENOUS | Status: DC

## 2024-07-17 MED ORDER — POVIDONE-IODINE 10 % EX SWAB
2.0000 | Freq: Once | CUTANEOUS | Status: AC
  Administered 2024-07-17: 2 via TOPICAL

## 2024-07-17 MED ORDER — OXYCODONE HCL 5 MG/5ML PO SOLN: 5.0000 mg | Freq: Once | ORAL | Status: DC | PRN

## 2024-07-17 MED ORDER — SODIUM CHLORIDE 0.9 % IV SOLN: INTRAVENOUS | Status: DC | PRN

## 2024-07-17 MED ORDER — CHLORHEXIDINE GLUCONATE 0.12 % MT SOLN
15.0000 mL | Freq: Once | OROMUCOSAL | Status: AC
  Administered 2024-07-17: 15 mL via OROMUCOSAL
  Filled 2024-07-17: qty 15

## 2024-07-17 MED ORDER — FENTANYL CITRATE (PF) 250 MCG/5ML IJ SOLN
INTRAMUSCULAR | Status: AC
  Filled 2024-07-17: qty 5

## 2024-07-17 MED ORDER — OXYCODONE HCL 5 MG PO TABS: 5.0000 mg | ORAL_TABLET | Freq: Once | ORAL | Status: DC | PRN

## 2024-07-17 MED ORDER — MIDAZOLAM HCL 2 MG/2ML IJ SOLN
INTRAMUSCULAR | Status: AC
  Filled 2024-07-17: qty 2

## 2024-07-17 MED ORDER — MIDAZOLAM HCL 2 MG/2ML IJ SOLN
INTRAMUSCULAR | Status: DC | PRN
  Administered 2024-07-17: 2 mg via INTRAVENOUS

## 2024-07-17 MED ORDER — SODIUM CHLORIDE 0.9 % IR SOLN
Status: DC | PRN
  Administered 2024-07-17: 4370 mL

## 2024-07-17 MED ORDER — INSULIN ASPART 100 UNIT/ML IJ SOLN
6.0000 [IU] | Freq: Once | INTRAMUSCULAR | Status: AC
  Administered 2024-07-17: 6 [IU] via SUBCUTANEOUS

## 2024-07-17 MED ORDER — ONDANSETRON HCL 4 MG/2ML IJ SOLN
INTRAMUSCULAR | Status: DC | PRN
  Administered 2024-07-17: 4 mg via INTRAVENOUS

## 2024-07-17 MED ORDER — FENTANYL CITRATE (PF) 250 MCG/5ML IJ SOLN
INTRAMUSCULAR | Status: DC | PRN
  Administered 2024-07-17 (×2): 50 ug via INTRAVENOUS

## 2024-07-17 MED ORDER — BUPIVACAINE HCL (PF) 0.5 % IJ SOLN
INTRAMUSCULAR | Status: AC
  Filled 2024-07-17: qty 20

## 2024-07-17 MED ORDER — ACETAMINOPHEN 10 MG/ML IV SOLN
INTRAVENOUS | Status: AC
  Filled 2024-07-17: qty 100

## 2024-07-17 MED ORDER — ACETAMINOPHEN 10 MG/ML IV SOLN
INTRAVENOUS | Status: DC | PRN
  Administered 2024-07-17: 1000 mg via INTRAVENOUS

## 2024-07-17 MED ORDER — ORAL CARE MOUTH RINSE: 15.0000 mL | Freq: Once | OROMUCOSAL | Status: AC

## 2024-07-17 MED ORDER — INSULIN ASPART 100 UNIT/ML IJ SOLN
0.0000 [IU] | INTRAMUSCULAR | Status: DC | PRN
  Administered 2024-07-17: 6 [IU] via SUBCUTANEOUS

## 2024-07-17 MED ORDER — PROPOFOL 10 MG/ML IV BOLUS
INTRAVENOUS | Status: AC
  Filled 2024-07-17: qty 20

## 2024-07-17 MED ORDER — FENTANYL CITRATE (PF) 100 MCG/2ML IJ SOLN: 25.0000 ug | INTRAMUSCULAR | Status: DC | PRN

## 2024-07-17 MED ORDER — ONDANSETRON HCL 4 MG/2ML IJ SOLN: 4.0000 mg | Freq: Once | INTRAMUSCULAR | Status: DC | PRN

## 2024-07-17 MED ORDER — CARVEDILOL 12.5 MG PO TABS
12.5000 mg | ORAL_TABLET | Freq: Once | ORAL | Status: AC
  Administered 2024-07-17: 12.5 mg via ORAL
  Filled 2024-07-17: qty 1

## 2024-07-17 MED ORDER — CEFAZOLIN SODIUM-DEXTROSE 2-4 GM/100ML-% IV SOLN
2.0000 g | INTRAVENOUS | Status: AC
  Administered 2024-07-17: 2 g via INTRAVENOUS
  Filled 2024-07-17: qty 100

## 2024-07-17 MED ORDER — PHENYLEPHRINE 80 MCG/ML (10ML) SYRINGE FOR IV PUSH (FOR BLOOD PRESSURE SUPPORT)
PREFILLED_SYRINGE | INTRAVENOUS | Status: DC | PRN
  Administered 2024-07-17: 80 ug via INTRAVENOUS
  Administered 2024-07-17 (×3): 160 ug via INTRAVENOUS

## 2024-07-17 MED ORDER — KETOROLAC TROMETHAMINE 15 MG/ML IJ SOLN
INTRAMUSCULAR | Status: DC | PRN
Start: 2024-07-17 — End: 2024-07-17
  Administered 2024-07-17: 15 mg via INTRAVENOUS

## 2024-07-17 MED ORDER — MEPERIDINE HCL 25 MG/ML IJ SOLN: 6.2500 mg | INTRAMUSCULAR | Status: DC | PRN

## 2024-07-17 SURGICAL SUPPLY — 15 items
CATH ROBINSON RED A/P 16FR (CATHETERS) ×2 IMPLANT
COVER MAYO STAND STRL (DRAPES) ×2 IMPLANT
DEVICE MYOSURE LITE (MISCELLANEOUS) IMPLANT
DEVICE MYOSURE REACH (MISCELLANEOUS) IMPLANT
GLOVE BIO SURGEON STRL SZ7 (GLOVE) ×2 IMPLANT
GLOVE SURG UNDER POLY LF SZ7 (GLOVE) ×2 IMPLANT
GOWN STRL REUS W/ TWL LRG LVL3 (GOWN DISPOSABLE) ×4 IMPLANT
KIT PROCEDURE FLUENT (KITS) ×2 IMPLANT
KIT TURNOVER KIT B (KITS) ×2 IMPLANT
PACK VAGINAL MINOR WOMEN LF (CUSTOM PROCEDURE TRAY) ×2 IMPLANT
PAD OB MATERNITY 11 LF (PERSONAL CARE ITEMS) ×2 IMPLANT
SEAL ROD LENS SCOPE MYOSURE (ABLATOR) ×2 IMPLANT
SYSTEM TISS REMOVAL MYOSURE XL (MISCELLANEOUS) IMPLANT
TOWEL GREEN STERILE FF (TOWEL DISPOSABLE) ×4 IMPLANT
UNDERPAD 30X36 HEAVY ABSORB (UNDERPADS AND DIAPERS) ×2 IMPLANT

## 2024-07-17 NOTE — Brief Op Note (Incomplete)
 07/17/2024  8:11 AM  PATIENT:  Anna [name]  53 y.o. female  PRE-OPERATIVE DIAGNOSIS:  fibroid  POST-OPERATIVE DIAGNOSIS:  fibroid  PROCEDURE:  Procedure(s): HYSTEROSCOPY WITH MYOMECTOMY (N/A) MYOSURE RESECTION (N/A)  SURGEON:  Surgeons and Role:    * Leva Rush, MD - Primary  PHYSICIAN ASSISTANT:   ASSISTANTS: {ASSISTANTS:31801}   ANESTHESIA:   {procedures; anesthesia:812}  EBL:  {None/Minimal: 21241}   BLOOD ADMINISTERED:{BLOOD GIVEN TYPES AND AMOUNTS:20467}  DRAINS: {Devices; drains:31758}   LOCAL MEDICATIONS USED:  {LOCAL MEDICATIONS:10721995}  SPECIMEN:  {ONC STAGING; AJCC TYPE OF SPECIMEN:115600001}  DISPOSITION OF SPECIMEN:  {SPECIMEN DISPOSITION:204680}  COUNTS:  {OR COUNTS CORRECT/INCORRECT:204690}  TOURNIQUET:  * No tourniquets in log *  DICTATION: .{DICTATION TYPES:[phone]}  PLAN OF CARE: {OPTIME PLAN OF RJMZ:8924449987}  PATIENT DISPOSITION:  {op note disposition:31782}   Delay start of Pharmacological VTE agent (>24hrs) due to surgical blood loss or risk of bleeding: {YES/NO/NOT APPLICABLE:20182}

## 2024-07-17 NOTE — Op Note (Signed)
 NAME: Delatte, Aria A. MEDICAL RECORD NO: [id number] ACCOUNT NO: [id number] DATE OF BIRTH: 02-26-1971 FACILITY: [facility] LOCATION: [facility] PHYSICIAN: Norleen CANDIE Skill, MD  Operative Report   DATE OF PROCEDURE: 07/17/2024  PREOPERATIVE DIAGNOSIS: Intrauterine fibroid.  POSTOPERATIVE DIAGNOSIS: Intrauterine fibroid.  PROCEDURE: Hysteroscopy with resection of intrauterine fibroid.  SURGEON: Norleen CANDIE Skill, MD  ANESTHESIA: General.  ESTIMATED BLOOD LOSS: 200 mL.  PACKS:  None.  DRAINS: None.  INTRAOPERATIVE BLOOD REPLACEMENT:  None.  COMPLICATIONS:  None.  INDICATIONS:  Are as dictated in history and physical.  DESCRIPTION OF PROCEDURE: The patient was taken to the OR and placed in supine position. After a satisfactory level of general anesthesia was obtained, she was placed in a dorsal lithotomy position using Allen stirrups. Perineum and vagina were prepped  out with Betadine. The bladder was emptied by catheterization. The patient was then draped in a sterile field. Speculum was placed in the vaginal vault. The cervix grasped with a single-tooth tenaculum. A paracervical block was instituted using 1%  Xylocaine. Uterus sounded to approximately 10 cm.  Cervix was serially dilated.  Hysteroscope was introduced.  Intrauterine cavity was distended using saline.  Visualization revealed a posterior wall intrauterine fibroid.  I did not see the polyp that we  had seen previously on ultrasound.  We brought in the large resectoscope and began resecting the fibroid. We got the majority of the fibroid removed. Then we started running into some bleeding and deficit was increasing. We decided to discontinue.  We  had gotten about 80% of the fibroid removed. Intrauterine cavity was otherwise clear. Endometrial curettings were then obtained.  The hysteroscope was removed.  Single-tooth tenaculum inspected and then removed.  The patient was taken out of the dorsal  lithotomy position.  Once alert and  extubated, transferred to the recovery room in good condition.  Sponge, instrument, and needle count were correct by a circulating nurse.    MUK D: 07/17/2024 8:06:23 am T: 07/17/2024 9:25:00 am  JOB: [id number]/ [id number]

## 2024-07-17 NOTE — Anesthesia Procedure Notes (Signed)
 Procedure Name: LMA Insertion Date/Time: 07/17/2024 7:26 AM  Performed by: Virgil Ee, CRNAPre-anesthesia Checklist: Patient identified, Patient being monitored, Timeout performed, Emergency Drugs available and Suction available Patient Re-evaluated:Patient Re-evaluated prior to induction Oxygen Delivery Method: Circle system utilized Preoxygenation: Pre-oxygenation with 100% oxygen Induction Type: IV induction Ventilation: Mask ventilation without difficulty LMA: LMA inserted LMA Size: 4.0 Tube type: Oral Number of attempts: 1 Placement Confirmation: positive ETCO2 and breath sounds checked- equal and bilateral Tube secured with: Tape Dental Injury: Teeth and Oropharynx as per pre-operative assessment

## 2024-07-17 NOTE — Transfer of Care (Signed)
 Immediate Anesthesia Transfer of Care Note  Patient: Anna [name]  Procedure(s) Performed: HYSTEROSCOPY WITH MYOMECTOMY (Uterus) MYOSURE RESECTION (Uterus)  Patient Location: PACU  Anesthesia Type:General  Level of Consciousness: drowsy, patient cooperative, and responds to stimulation  Airway & Oxygen Therapy: Patient Spontanous Breathing and Patient connected to nasal cannula oxygen  Post-op Assessment: Report given to RN and Post -op Vital signs reviewed and stable  Post vital signs: Reviewed and stable  Last Vitals:  Vitals Value Taken Time  BP 97/68 07/17/24 08:05  Temp 36.3 C 07/17/24 08:05  Pulse 78 07/17/24 08:12  Resp 14 07/17/24 08:12  SpO2 92 % 07/17/24 08:12  Vitals shown include unfiled device data.  Last Pain:  Vitals:   07/17/24 0805  TempSrc:   PainSc: Asleep         Complications: No notable events documented.

## 2024-07-17 NOTE — Anesthesia Postprocedure Evaluation (Signed)
 Anesthesia Post Note  Patient: Hazelle A Villella  Procedure(s) Performed: HYSTEROSCOPY WITH MYOMECTOMY (Uterus) MYOSURE RESECTION (Uterus)     Patient location during evaluation: PACU Anesthesia Type: General Level of consciousness: awake and alert Pain management: pain level controlled Vital Signs Assessment: post-procedure vital signs reviewed and stable Respiratory status: spontaneous breathing, nonlabored ventilation, respiratory function stable and patient connected to nasal cannula oxygen Cardiovascular status: blood pressure returned to baseline and stable Postop Assessment: no apparent nausea or vomiting Anesthetic complications: no   No notable events documented.  Last Vitals:  Vitals:   07/17/24 0930 07/17/24 0932  BP: (!) 93/59 (!) 93/59  Pulse: 69 74  Resp: 14 15  Temp:  36.4 C  SpO2: 94% 95%    Last Pain:  Vitals:   07/17/24 0805  TempSrc:   PainSc: Asleep                 Willow Reczek

## 2024-07-17 NOTE — Brief Op Note (Signed)
 07/17/2024  8:07 AM  PATIENT:  Anna [name]  53 y.o. female  PRE-OPERATIVE DIAGNOSIS:  fibroid  POST-OPERATIVE DIAGNOSIS:  fibroid  PROCEDURE:  Procedure(s): HYSTEROSCOPY WITH MYOMECTOMY (N/A) MYOSURE RESECTION (N/A)  SURGEON:  Surgeons and Role:    * Leva Rush, MD - Primary  PHYSICIAN ASSISTANT:   ASSISTANTS: none   ANESTHESIA:   general  EBL:  200 cc   BLOOD ADMINISTERED:none  DRAINS: none   LOCAL MEDICATIONS USED:  XYLOCAINE   SPECIMEN:  Source of Specimen:  uterine fiborid  DISPOSITION OF SPECIMEN:  PATHOLOGY  COUNTS:  YES  TOURNIQUET:  * No tourniquets in log *  DICTATION: Iline Dictation [id number]  PLAN OF CARE: Discharge to home after PACU  PATIENT DISPOSITION:  PACU - hemodynamically stable.   Delay start of Pharmacological VTE agent (>24hrs) due to surgical blood loss or risk of bleeding: not applicable

## 2024-07-17 NOTE — H&P (Signed)
  History and physical exam unchanged 

## 2024-07-18 ENCOUNTER — Encounter (HOSPITAL_COMMUNITY): Payer: Self-pay | Admitting: Obstetrics and Gynecology

## 2024-07-18 LAB — SURGICAL PATHOLOGY

## 2024-07-21 ENCOUNTER — Other Ambulatory Visit: Payer: Self-pay

## 2024-07-21 ENCOUNTER — Telehealth: Payer: Self-pay

## 2024-07-21 MED ORDER — METFORMIN HCL 500 MG PO TABS: 500.0000 mg | ORAL_TABLET | Freq: Two times a day (BID) | ORAL | 2 refills | Status: DC

## 2024-07-21 NOTE — Telephone Encounter (Signed)
 Copied from CRM #[id number]. Topic: Clinical - Medication Question >> Jul 21, 2024 11:56 AM Vena HERO wrote: Reason for CRM: Pt had [facility] 10/02 at [facility] and was told her BS was too high at 365, was given insulin  before surgery. Given insulin  again after and sugar came down to 249. Dr suggested that maybe an increase in metformin  is necessary. Pt is inquiring on what is the next best step to help get this under control. Please call to advise  Message was sent to PCP to address

## 2024-07-29 ENCOUNTER — Ambulatory Visit: Payer: Self-pay

## 2024-07-29 NOTE — Telephone Encounter (Signed)
 FYI Only or Action Required?: Action required by provider: update on patient condition.  Patient was last seen in primary care on 06/02/2024 by Allwardt, Mardy HERO, PA-C.  Called Nurse Triage reporting Hyperglycemia.  Symptoms began several days ago.  Interventions attempted: Prescription medications: metformin  and Dietary changes, drinks 120oz water daily.  Symptoms are: hyperglycemia (fasting blood glucose this morning 290), weight loss (2 pounds over 2 weeks) unchanged.  Triage Disposition: Home Care  Patient/caregiver understands and will follow disposition?: Yes          Copied from CRM #[id number]. Topic: Clinical - Red Word Triage >> Jul 29, 2024  3:02 PM Suzen RAMAN wrote: Red Word that prompted transfer to Nurse Triage: Elevated Blood Sugar; last reading 290. Reason for Disposition  [1] Blood glucose 240 - 300 mg/dL (13.3 - 16.7 mmol/L) AND [2] does not use insulin  (e.g., not insulin -dependent; most people with type 2 diabetes)  Answer Assessment - Initial Assessment Questions 1. BLOOD GLUCOSE: What is your blood glucose level?      290.  2. ONSET: When did you check the blood glucose?     This morning (fasting).  3. USUAL RANGE: What is your glucose level usually? (e.g., usual fasting morning value, usual evening value)     She adds back in August her BG was in the 500s. She states her usual fasting morning value is 240-250.  4. KETONES: Do you check for ketones (urine or blood test strips)? If Yes, ask: What does the test show now?      No.  5. TYPE 1 or 2:  Do you know what type of diabetes you have?  (e.g., Type 1, Type 2, Gestational; doesn't know)      Type 2.  6. INSULIN : Do you take insulin ? What type of insulin (s) do you use? What is the mode of delivery? (syringe, pen; injection or pump)?      No. During her pre op and recovery from surgery she was given insulin .  7. DIABETES PILLS: Do you take any pills for your diabetes? If Yes, ask:  Have you missed taking any pills recently?     Yes, metformin . No missed doses. She states it has been about 8 days since she has increased her dosage to 1,000mg  at night in addition to her 500mg  in the morning.  8. OTHER SYMPTOMS: Do you have any symptoms? (e.g., fever, frequent urination, difficulty breathing, dizziness, weakness, vomiting)     Weight loss (2 pounds over 2 weeks). Denies nausea, vomiting, increased thirst or urination, weakness, confusion, difficulty breathing.  9. PREGNANCY: Is there any chance you are pregnant? When was your last menstrual period?     N/a.  Patient states she had surgery about 2 weeks ago (07/17/24) and in pre op her blood sugar was over 300 and her surgery was almost cancelled.  Protocols used: Diabetes - High Blood Sugar-A-AH

## 2024-07-29 NOTE — Telephone Encounter (Signed)
 Called pt and advised increased dose, pt verbalized understanding and confirms tolerating current dose well

## 2024-07-29 NOTE — Telephone Encounter (Signed)
 Please see pt update and advise recommendations

## 2024-08-05 ENCOUNTER — Other Ambulatory Visit: Payer: Self-pay | Admitting: Physician Assistant

## 2024-08-07 ENCOUNTER — Telehealth: Payer: Self-pay

## 2024-08-07 ENCOUNTER — Other Ambulatory Visit: Payer: Self-pay

## 2024-08-07 MED ORDER — METFORMIN HCL 500 MG PO TABS: ORAL_TABLET | ORAL | 0 refills | Status: AC

## 2024-08-07 NOTE — Telephone Encounter (Signed)
 Copied from CRM #[id number]. Topic: Clinical - Medication Question >> Aug 07, 2024 12:49 PM Burnard DEL wrote: Reason for CRM: Patient would like  to have   prescription for dosage increase for metformin  sent to pharmacy. Patient was switched to 1000 mg twice daily by provider a few days,and she is needing prescription to reflect that to be sent to pharmacy   [id number] [place], [place] - [address]  Phone: [phone] Fax: [phone]  New Rx with dose change sent to pharmacy patient requested. Per note pt is to take 1 tablet in am and 2 tablets in PM.

## 2024-08-22 DIAGNOSIS — S62307A Unspecified fracture of fifth metacarpal bone, left hand, initial encounter for closed fracture: Secondary | ICD-10-CM | POA: Diagnosis not present

## 2024-08-22 DIAGNOSIS — M79642 Pain in left hand: Secondary | ICD-10-CM | POA: Insufficient documentation

## 2024-08-29 DIAGNOSIS — S62307A Unspecified fracture of fifth metacarpal bone, left hand, initial encounter for closed fracture: Secondary | ICD-10-CM | POA: Diagnosis not present

## 2024-09-02 ENCOUNTER — Encounter: Payer: Self-pay | Admitting: Physician Assistant

## 2024-09-02 ENCOUNTER — Ambulatory Visit: Payer: Self-pay | Admitting: Physician Assistant

## 2024-09-02 VITALS — BP 136/86 | HR 78 | Temp 97.2°F | Resp 16 | Ht 63.0 in | Wt 150.6 lb

## 2024-09-02 DIAGNOSIS — Z7984 Long term (current) use of oral hypoglycemic drugs: Secondary | ICD-10-CM | POA: Diagnosis not present

## 2024-09-02 DIAGNOSIS — I10 Essential (primary) hypertension: Secondary | ICD-10-CM | POA: Diagnosis not present

## 2024-09-02 DIAGNOSIS — E1165 Type 2 diabetes mellitus with hyperglycemia: Secondary | ICD-10-CM | POA: Diagnosis not present

## 2024-09-02 LAB — POCT GLYCOSYLATED HEMOGLOBIN (HGB A1C): Hemoglobin A1C: 12.2 % — ABNORMAL HIGH (ref 4.0–5.6)

## 2024-09-02 MED ORDER — ACCU-CHEK SOFTCLIX LANCETS MISC: 0 refills | Status: DC

## 2024-09-02 MED ORDER — GLIPIZIDE ER 2.5 MG PO TB24: 2.5000 mg | ORAL_TABLET | Freq: Every day | ORAL | 2 refills | Status: DC

## 2024-09-02 MED ORDER — ACCU-CHEK GUIDE TEST VI STRP: ORAL_STRIP | 0 refills | Status: DC

## 2024-09-02 NOTE — Patient Instructions (Signed)
  VISIT SUMMARY: Today, we discussed your type 2 diabetes management and made some adjustments to your medications to help improve your blood sugar levels and reduce side effects. We also reviewed your blood pressure management, which is currently well-controlled.  YOUR PLAN: TYPE 2 DIABETES MELLITUS WITH HYPERGLYCEMIA: Your blood sugar levels have been improving but are still higher than desired. You have been experiencing stomach discomfort from metformin . -Reduce metformin  to 500 mg twice daily.  -Start taking glipizide in the morning to help control your blood sugar levels. -We performed a finger stick A1c test today. -Your glipizide prescription has been sent to [facility] on [address]. -You have refills for lancets and test strips.  ESSENTIAL HYPERTENSION: Your blood pressure is generally well-controlled with your current medications. -Continue taking losartan  50 mg twice daily, carvedilol, and amlodipine. -You can monitor your blood pressure at home if you prefer.                      Contains text generated by [employer]bridge.                                 Contains text generated by Abridge.

## 2024-09-02 NOTE — Progress Notes (Signed)
 Patient ID: Anna [name], female    DOB: 1971-08-12, 53 y.o.   MRN: [id number]   Assessment & Plan:  Type 2 diabetes mellitus with hyperglycemia, without long-term current use of insulin  (HCC) -     Accu-Chek Softclix Lancets; Use as instructed  Dispense: 100 each; Refill: 0 -     Accu-Chek Guide Test; Use as instructed  Dispense: 300 each; Refill: 0 -     POCT glycosylated hemoglobin (Hb A1C)  Long term current use of oral hypoglycemic drug  Primary hypertension  Other orders -     glipiZIDE ER; Take 1 tablet (2.5 mg total) by mouth daily with breakfast.  Dispense: 30 tablet; Refill: 2    Assessment & Plan Type 2 diabetes mellitus with hyperglycemia Type 2 diabetes mellitus with hyperglycemia, previously with an A1c of 14.7% in August. Current glucose levels are improving but remain elevated, with morning readings averaging 272 mg/dL, recently reduced to 754 mg/dL. Metformin  500 mg three times daily is causing gastrointestinal side effects. Considering alternative medication to improve glycemic control and reduce gastrointestinal side effects. Glipizide, a sulfonylurea, is proposed as an adjunct to metformin  to achieve better glycemic control with fewer gastrointestinal side effects. The goal is to reduce A1c to below 7% for optimal control. - Reduced metformin  to 500 mg twice daily. - Initiated glipizide in the morning to improve glycemic control. - Performed finger stick A1c test today. - Sent glipizide prescription to [facility] on [address]. - Provided refills for lancets and test strips. Lab Results  Component Value Date   HGBA1C 12.2 (H) 09/02/2024   HGBA1C 14.7 (A) 06/02/2024   HGBA1C 6.7 (H) 08/10/2021     Essential hypertension Essential hypertension, currently managed with losartan , carvedilol, and amlodipine. Blood pressure readings are generally well-controlled, with occasional elevations in the office setting. She expresses dislike for medication and prefers to  monitor blood pressure at home if needed. Goal 120/80 or less. Keep working on lifestyle changes. - Continue current antihypertensive regimen: losartan  50 mg twice daily, carvedilol, and amlodipine. - Monitor blood pressure at home if desired.      Return in about 3 months (around 12/03/2024) for recheck/follow-up.    Subjective:    Chief Complaint  Patient presents with   Follow-up    Metformin  is causing GI issues. Expressed that it has calmed down slightly.    Hypertension    Wants to discuss discontinuing some medications due to weight loss.    HPI Discussed the use of AI scribe software for clinical note transcription with the patient, who gave verbal consent to proceed.  History of Present Illness Anna [name] is a 53 year old female with type 2 diabetes who presents for a regular follow-up visit.  She is managing her type 2 diabetes with metformin  500 mg, one tablet in the morning and two in the evening. Her last A1c in August was 14.7. Blood glucose levels have been improving, with morning readings averaging around 272 mg/dL, recently decreasing to 245 mg/dL. She experiences gastrointestinal discomfort from metformin , which is starting to improve. She had blood work done in October and anticipates her A1c to be around 10.7.  She recently sustained a boxer's fracture of the fifth metacarpal in her left hand after accidentally hitting a doorframe. She is under the care of Beverley Millman for this injury. The fracture is painful but did not require surgery and is expected to heal in four to six weeks.  Blood pressure readings have been  variable, with diastolic pressures usually around 70 mmHg. She is currently taking losartan  50 mg twice daily, carvedilol, and amlodipine. She no longer takes meloxicam .  Her mother has been supportive, reminding her that managing diabetes takes time. Her mother has a morning blood glucose level of around 98-100 mg/dL, which she aspires to  achieve.     Past Medical History:  Diagnosis Date   Allergy    Diabetes mellitus without complication (HCC)    Endometriosis    Diagnosed at age 76. Followed by Gynecology -- Dr. 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     Past Surgical History:  Procedure Laterality Date   ABLATION ON ENDOMETRIOSIS  1997   COLONOSCOPY  03/2018   deviated setum  1986   ESOPHAGOGASTRODUODENOSCOPY  03/2018   HYSTEROSCOPY WITH MYOMECTOMY N/A 07/17/2024   Procedure: HYSTEROSCOPY WITH MYOMECTOMY;  Surgeon: Leva Rush, MD;  Location: [facility] OR;  Service: Gynecology;  Laterality: N/A;   MYOMECTOMY     MYOSURE RESECTION N/A 07/17/2024   Procedure: MELINDA RESECTION;  Surgeon: Leva Rush, MD;  Location: [facility] OR;  Service: Gynecology;  Laterality: N/A;    Family History  Problem Relation Age of Onset   Hypertension Mother    Diabetes Mother 38   Heart attack Father 21   Lung disease Father    Hypertension Father    Emphysema Maternal Grandmother    Food Allergy Daughter    Asthma Daughter    Hepatitis C Maternal Uncle    Lung disease Paternal Uncle    Colon cancer Neg Hx    Esophageal cancer Neg Hx    Liver cancer Neg Hx    Pancreatic cancer Neg Hx    Stomach cancer Neg Hx    Rectal cancer Neg Hx     Social History   Tobacco Use   Smoking status: Never   Smokeless tobacco: Never  Vaping Use   Vaping status: Never Used  Substance Use Topics   Alcohol use: Yes   Drug use: No     Allergies  Allergen Reactions   Erythromycin Rash and Dermatitis   Nitrofurantoin Other (See Comments), Rash and Dermatitis   Red Dye #40 (Allura Red) Rash    Review of Systems NEGATIVE UNLESS OTHERWISE INDICATED IN HPI      Objective:     BP 136/86   Pulse 78   Temp (!) 97.2 F (36.2 C) (Temporal)   Resp 16   Ht 5'  3 (1.6 m)   Wt 150 lb 9.6 oz (68.3 kg)   SpO2 98%   BMI 26.68 kg/m   Wt Readings from Last 3 Encounters:  09/02/24 150 lb 9.6 oz (68.3 kg)  07/17/24 149 lb (67.6 kg)  06/02/24 155 lb 12.8 oz (70.7 kg)    BP Readings from Last 3 Encounters:  09/02/24 136/86  07/17/24 (!) 93/59  06/02/24 132/88     Physical Exam Vitals and nursing note reviewed.  Constitutional:      General: She is not in acute distress.    Appearance: Normal appearance. She is not ill-appearing.  HENT:     Head: Normocephalic.     Right Ear: External ear normal.     Left Ear: External ear normal.  Eyes:     Extraocular  Movements: Extraocular movements intact.     Conjunctiva/sclera: Conjunctivae normal.     Pupils: Pupils are equal, round, and reactive to light.  Cardiovascular:     Rate and Rhythm: Normal rate and regular rhythm.     Pulses: Normal pulses.     Heart sounds: Normal heart sounds. No murmur heard. Pulmonary:     Effort: Pulmonary effort is normal. No respiratory distress.     Breath sounds: Normal breath sounds. No wheezing.  Musculoskeletal:     Cervical back: Normal range of motion.  Skin:    General: Skin is warm.  Neurological:     Mental Status: She is alert and oriented to person, place, and time.  Psychiatric:        Mood and Affect: Mood normal.        Behavior: Behavior normal.             Filmore Molyneux M Maelie Chriswell, PA-C

## 2024-09-03 ENCOUNTER — Other Ambulatory Visit: Payer: Self-pay

## 2024-09-03 DIAGNOSIS — E1165 Type 2 diabetes mellitus with hyperglycemia: Secondary | ICD-10-CM

## 2024-09-03 MED ORDER — ACCU-CHEK GUIDE TEST VI STRP
ORAL_STRIP | 0 refills | Status: AC
Start: 2024-09-03 — End: ?

## 2024-09-03 MED ORDER — ACCU-CHEK SOFTCLIX LANCETS MISC: 0 refills | Status: DC

## 2024-09-09 DIAGNOSIS — S62307A Unspecified fracture of fifth metacarpal bone, left hand, initial encounter for closed fracture: Secondary | ICD-10-CM | POA: Diagnosis not present

## 2024-10-01 ENCOUNTER — Other Ambulatory Visit: Payer: Self-pay | Admitting: Physician Assistant

## 2024-10-01 DIAGNOSIS — E1165 Type 2 diabetes mellitus with hyperglycemia: Secondary | ICD-10-CM

## 2024-10-17 ENCOUNTER — Ambulatory Visit: Admitting: Family

## 2024-11-14 ENCOUNTER — Other Ambulatory Visit: Payer: Self-pay | Admitting: Physician Assistant

## 2024-11-17 NOTE — Progress Notes (Signed)
 Anna [name]                                          MRN: [id number]   11/17/2024   The [facility] Quality Team Specialist reviewed this patient medical record for the purposes of chart review for care gap closure. The following were reviewed: chart review for care gap closure-glycemic status assessment.    [facility] Quality Team

## 2024-11-19 ENCOUNTER — Other Ambulatory Visit: Payer: Self-pay | Admitting: Physician Assistant

## 2024-12-04 ENCOUNTER — Ambulatory Visit: Payer: Self-pay | Admitting: Physician Assistant
